# Patient Record
Sex: Female | Born: 2000 | Race: White | Hispanic: No | Marital: Single | Smoking: Former smoker
Health system: Southern US, Community
[De-identification: ages and names within clinical notes are randomized; demographics above are authoritative.]

## PROBLEM LIST (undated history)

## (undated) DIAGNOSIS — J45909 Unspecified asthma, uncomplicated: Secondary | ICD-10-CM

## (undated) DIAGNOSIS — G43909 Migraine, unspecified, not intractable, without status migrainosus: Secondary | ICD-10-CM

## (undated) DIAGNOSIS — F988 Other specified behavioral and emotional disorders with onset usually occurring in childhood and adolescence: Secondary | ICD-10-CM

## (undated) HISTORY — PX: TONSILLECTOMY: SUR1361

---
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
## 2021-01-10 ENCOUNTER — Emergency Department
Admission: EM | Admit: 2021-01-10 | Discharge: 2021-01-10 | Disposition: A | Discharge status: Home / Self Care | Payer: BLUE CROSS/BLUE SHIELD | Attending: Emergency Medicine | Admitting: Emergency Medicine

## 2021-01-10 ENCOUNTER — Encounter: Payer: Self-pay | Admitting: Emergency Medicine

## 2021-01-10 ENCOUNTER — Other Ambulatory Visit: Payer: Self-pay

## 2021-01-10 DIAGNOSIS — Z87891 Personal history of nicotine dependence: Secondary | ICD-10-CM | POA: Diagnosis not present

## 2021-01-10 DIAGNOSIS — T7840XA Allergy, unspecified, initial encounter: Secondary | ICD-10-CM

## 2021-01-10 DIAGNOSIS — Z91010 Allergy to peanuts: Secondary | ICD-10-CM | POA: Insufficient documentation

## 2021-01-10 DIAGNOSIS — X58XXXA Exposure to other specified factors, initial encounter: Secondary | ICD-10-CM | POA: Insufficient documentation

## 2021-01-10 DIAGNOSIS — J45909 Unspecified asthma, uncomplicated: Secondary | ICD-10-CM | POA: Diagnosis not present

## 2021-01-10 MED ORDER — METHYLPREDNISOLONE SODIUM SUCC 125 MG IJ SOLR
125.0000 mg | Freq: Once | INTRAMUSCULAR | Status: AC
  Administered 2021-01-10: 125 mg via INTRAVENOUS
  Filled 2021-01-10: qty 2

## 2021-01-10 MED ORDER — EPINEPHRINE 0.3 MG/0.3ML IJ SOAJ: 0.3000 mg | INTRAMUSCULAR | 1 refills | Status: DC | PRN

## 2021-01-10 MED ORDER — DIPHENHYDRAMINE HCL 50 MG/ML IJ SOLN
50.0000 mg | Freq: Once | INTRAMUSCULAR | Status: AC
  Administered 2021-01-10: 50 mg via INTRAVENOUS
  Filled 2021-01-10: qty 1

## 2021-01-10 NOTE — ED Triage Notes (Signed)
Patient ambulatory to triage with steady gait, without difficulty or distress noted; pt reports using epipen PTA after eating hotdog and soda; initially had swelling to tongue and cont to have diff swallowing

## 2021-01-10 NOTE — ED Provider Notes (Signed)
[facility] Emergency Department Provider Note  ____________________________________________  Time seen: Approximately 4:28 AM  I have reviewed the triage vital signs and the nursing notes.   HISTORY  Chief Complaint Allergic Reaction   HPI Leslie Logan is a 20 y.o. 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  Patient reports that she went to a cookout today and had a hot dog and a soda.  About 10 minutes after that she started noticed tingling and swelling of her tongue and was having difficulty swallowing which prompted her to use one of her EpiPen's.  She reports that these are similar symptoms she has had in the past with anaphylaxis.  At this time she reports that her symptoms resolved.  She used the EpiPen just prior to arrival.  She denies any throat closing sensation at this time, tongue swelling, wheezing, difficulty breathing, chest pain, shortness of breath, vomiting or diarrhea.   Past Medical History:  Diagnosis Date   Asthma    Migraine    Past Surgical History:  Procedure Laterality Date   TONSILLECTOMY      Prior to Admission medications   Medication Sig Start Date End Date Taking? Authorizing Provider  EPINEPHrine (EPIPEN 2-PAK) 0.3 mg/0.3 mL IJ SOAJ injection Inject 0.3 mg into the muscle as needed for anaphylaxis. 01/10/21  Yes Cristiano Capri, [place], MD  albuterol (PROVENTIL) (2.5 MG/3ML) 0.083% nebulizer solution Inhale into the lungs. 09/20/17   [provider]  albuterol (VENTOLIN HFA) 108 (90 Base) MCG/ACT inhaler Two puffs every 6 hours 09/20/17   [provider]  amphetamine-dextroamphetamine (ADDERALL) 10 MG tablet Take by mouth. 01/13/20   [provider]  budesonide (PULMICORT) 0.5 MG/2ML nebulizer solution Use via nebulizer along with albuterol respule  twice a dayas needed 10/09/19   [provider]   HYDROcodone-acetaminophen (NORCO) 5-325 MG tablet Take 1 tablet by mouth every 4 (four) hours as needed for moderate pain. 07/12/20   Merwyn Katos, MD  ibuprofen (ADVIL) 600 MG tablet Take 1 tablet (600 mg total) by mouth every 6 (six) hours as needed for moderate pain. 07/11/20   Shaune Pollack, MD  ipratropium-albuterol (DUONEB) 0.5-2.5 (3) MG/3ML SOLN Take 3 mLs by nebulization every 4 (four) hours as needed. 02/07/20   Sherrie Mustache Roselyn Bering, PA-C  nystatin-triamcinolone (MYCOLOG II) cream Apply to affected area once or twice a day as needed 05/27/20   Ivette Loyal, NP  ondansetron (ZOFRAN ODT) 4 MG disintegrating tablet Take 1 tablet (4 mg total) by mouth every 8 (eight) hours as needed for nausea or vomiting. 07/11/20   Shaune Pollack, MD  predniSONE (STERAPRED UNI-PAK 21 TAB) 10 MG (21) TBPK tablet Take 6 pills on day one then decrease by 1 pill each day 02/07/20   Faythe Ghee, PA-C    Allergies Barley grass, Corn-containing products, Food [peanut-containing drug products], Lactose, and Other  No family history on file.  Social History Social History   Tobacco Use   Smoking status: Former   Smokeless tobacco: Never  Building services engineer Use: Never used  Substance Use Topics   Alcohol use: Yes   Drug use: Yes    Types: Marijuana    Review of Systems  Constitutional: Negative for fever. Eyes: Negative for visual changes. ENT: + tongue swelling and throat closing sensation Neck: No neck pain  Cardiovascular: Negative for chest pain. Respiratory: Negative for shortness of breath. Gastrointestinal: Negative  for abdominal pain, vomiting or diarrhea. Genitourinary: Negative for dysuria. Musculoskeletal: Negative for back pain. Skin: Negative for rash. Neurological: Negative for headaches, weakness or numbness. Psych: No SI or HI  ____________________________________________   PHYSICAL EXAM:  VITAL SIGNS: ED Triage Vitals  Enc Vitals Group     BP 01/10/21 0342 131/85      Pulse Rate 01/10/21 0342 (!) 123     Resp 01/10/21 0342 18     Temp 01/10/21 0342 98 F (36.7 C)     Temp Source 01/10/21 0342 Oral     SpO2 01/10/21 0342 100 %     Weight 01/10/21 0339 140 lb (63.5 kg)     Height 01/10/21 0339 5' (1.524 m)     Head Circumference --      Peak Flow --      Pain Score 01/10/21 0338 0     Pain Loc --      Pain Edu? --      Excl. in GC? --     Constitutional: Alert and oriented. Well appearing and in no apparent distress. HEENT:      Head: Normocephalic and atraumatic.         Eyes: Conjunctivae are normal. Sclera is non-icteric.       Mouth/Throat: Mucous membranes are moist.  No angioedema, tongue and uvula are normal with no swelling, no stridor, airways patent      Neck: Supple with no signs of meningismus. Cardiovascular: Regular rate and rhythm. No murmurs, gallops, or rubs. 2+ symmetrical distal pulses are present in all extremities. No JVD. Respiratory: Normal respiratory effort. Lungs are clear to auscultation bilaterally.  Gastrointestinal: Soft, non tender. Musculoskeletal:  No edema, cyanosis, or erythema of extremities. Neurologic: Normal speech and language. Face is symmetric. Moving all extremities. No gross focal neurologic deficits are appreciated. Skin: Skin is warm, dry and intact. No rash noted. Psychiatric: Mood and affect are normal. Speech and behavior are normal.  ____________________________________________   LABS (all labs ordered are listed, but only abnormal results are displayed)  Labs Reviewed - No data to display ____________________________________________  EKG  none  ____________________________________________  RADIOLOGY  none  ____________________________________________   PROCEDURES  Procedure(s) performed: None Procedures   Critical Care performed:  None ____________________________________________   INITIAL IMPRESSION / ASSESSMENT AND PLAN / ED COURSE  20 y.o. 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  Patient was having tongue swelling and throat closing sensation after eating a cookout.  She is self administer an EpiPen prior to arrival.  Now back to baseline.  She denies any hives, chest pain, shortness of breath, wheezing, throat closing sensation, angioedema.  Has had several prior episodes of anaphylaxis.  Will monitor for 3 hours post EpiPen for any recurrence of her symptoms.  Will give IV Benadryl and Solu-Medrol.  Patient placed on telemetry for close monitoring.  _________________________ 6:22 AM on 01/10/2021 ----------------------------------------- Patient monitored for 3 hours post EpiPen with no recurrence of her symptoms.  Will discharge home on supportive care.  Patient given refill for her EpiPen.  Recommended follow-up with PCP.    _____________________________________________ Please note:  Patient was evaluated in Emergency Department today for the symptoms described in the history of present illness. Patient was evaluated in the context of the global COVID-19 pandemic, which necessitated consideration that the patient might be at risk for infection with the SARS-CoV-2 virus that causes COVID-19. Institutional protocols and algorithms that  pertain to the evaluation of patients at risk for COVID-19 are in a state of rapid change based on information released by regulatory bodies including the CDC and federal and state organizations. These policies and algorithms were followed during the patient's care in the ED.  Some ED evaluations and interventions may be delayed as a result of limited staffing during the pandemic.    Controlled Substance Database was reviewed by me. ____________________________________________   FINAL CLINICAL IMPRESSION(S) / ED DIAGNOSES   Final diagnoses:  Allergic reaction, initial encounter      NEW MEDICATIONS STARTED DURING THIS VISIT:  ED  Discharge Orders          Ordered    EPINEPHrine (EPIPEN 2-PAK) 0.3 mg/0.3 mL IJ SOAJ injection  As needed        01/10/21 0608             Note:  This document was prepared using Dragon voice recognition software and may include unintentional dictation errors.    Nita Sickle, MD 01/10/21 [phone]

## 2021-02-01 ENCOUNTER — Emergency Department: Payer: BLUE CROSS/BLUE SHIELD

## 2021-02-01 ENCOUNTER — Emergency Department
Admission: EM | Admit: 2021-02-01 | Discharge: 2021-02-01 | Disposition: A | Discharge status: Home / Self Care | Payer: BLUE CROSS/BLUE SHIELD | Attending: Emergency Medicine | Admitting: Emergency Medicine

## 2021-02-01 ENCOUNTER — Other Ambulatory Visit: Payer: Self-pay

## 2021-02-01 DIAGNOSIS — Z7951 Long term (current) use of inhaled steroids: Secondary | ICD-10-CM | POA: Insufficient documentation

## 2021-02-01 DIAGNOSIS — X501XXA Overexertion from prolonged static or awkward postures, initial encounter: Secondary | ICD-10-CM | POA: Diagnosis not present

## 2021-02-01 DIAGNOSIS — Z87891 Personal history of nicotine dependence: Secondary | ICD-10-CM | POA: Insufficient documentation

## 2021-02-01 DIAGNOSIS — J45909 Unspecified asthma, uncomplicated: Secondary | ICD-10-CM | POA: Diagnosis not present

## 2021-02-01 DIAGNOSIS — S93402A Sprain of unspecified ligament of left ankle, initial encounter: Secondary | ICD-10-CM | POA: Insufficient documentation

## 2021-02-01 DIAGNOSIS — Z91010 Allergy to peanuts: Secondary | ICD-10-CM | POA: Diagnosis not present

## 2021-02-01 DIAGNOSIS — S99912A Unspecified injury of left ankle, initial encounter: Secondary | ICD-10-CM | POA: Diagnosis present

## 2021-02-01 NOTE — ED Provider Notes (Signed)
[facility] Emergency Department Provider Note  ____________________________________________   Event Date/Time   First MD Initiated Contact with Patient 02/01/21 1102     (approximate)  I have reviewed the triage vital signs and the nursing notes.   HISTORY  Chief Complaint Ankle Pain    HPI Leslie Logan is a 20 y.o. female presents emergency department complaining of left ankle pain.  Patient has twisted the left ankle at least 3 times over the past few weeks.  History of ankle sprains and ligament tears.  States she feels like it is unstable and it keeps giving out on her.  No numbness or tingling.  Past Medical History:  Diagnosis Date   Asthma    Migraine     There are no problems to display for this patient.   Past Surgical History:  Procedure Laterality Date   TONSILLECTOMY      Prior to Admission medications   Medication Sig Start Date End Date Taking? Authorizing Provider  albuterol (PROVENTIL) (2.5 MG/3ML) 0.083% nebulizer solution Inhale into the lungs. 09/20/17   [provider]  albuterol (VENTOLIN HFA) 108 (90 Base) MCG/ACT inhaler Two puffs every 6 hours 09/20/17   [provider]  amphetamine-dextroamphetamine (ADDERALL) 10 MG tablet Take by mouth. 01/13/20   [provider]  budesonide (PULMICORT) 0.5 MG/2ML nebulizer solution Use via nebulizer along with albuterol respule  twice a dayas needed 10/09/19   [provider]  EPINEPHrine (EPIPEN 2-PAK) 0.3 mg/0.3 mL IJ SOAJ injection Inject 0.3 mg into the muscle as needed for anaphylaxis. 01/10/21   Nita Sickle, MD  HYDROcodone-acetaminophen [facility]) 5-325 MG tablet Take 1 tablet by mouth every 4 (four) hours as needed for moderate pain. 07/12/20   Merwyn Katos, MD  ibuprofen (ADVIL) 600 MG tablet Take 1 tablet (600 mg total) by mouth every 6 (six) hours as needed for moderate pain. 07/11/20   Shaune Pollack, MD   ipratropium-albuterol (DUONEB) 0.5-2.5 (3) MG/3ML SOLN Take 3 mLs by nebulization every 4 (four) hours as needed. 02/07/20   Sherrie Mustache Roselyn Bering, PA-C  nystatin-triamcinolone (MYCOLOG II) cream Apply to affected area once or twice a day as needed 05/27/20   Ivette Loyal, NP  ondansetron (ZOFRAN ODT) 4 MG disintegrating tablet Take 1 tablet (4 mg total) by mouth every 8 (eight) hours as needed for nausea or vomiting. 07/11/20   Shaune Pollack, MD  predniSONE (STERAPRED UNI-PAK 21 TAB) 10 MG (21) TBPK tablet Take 6 pills on day one then decrease by 1 pill each day 02/07/20   Faythe Ghee, PA-C    Allergies Barley grass, Corn-containing products, Food [peanut-containing drug products], Lactose, and Other  No family history on file.  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 ENT: No sore throat. Respiratory: Denies cough Genitourinary: Negative for dysuria. Musculoskeletal: Negative for back pain.  Positive for left ankle pain Skin: Negative for rash. Psychiatric: no mood changes,     ____________________________________________   PHYSICAL EXAM:  VITAL SIGNS: ED Triage Vitals  Enc Vitals Group     BP 02/01/21 0956 128/89     Pulse Rate 02/01/21 0956 (!) 109     Resp 02/01/21  0956 15     Temp 02/01/21 0956 98.1 F (36.7 C)     Temp Source 02/01/21 0956 Oral     SpO2 02/01/21 0956 100 %     Weight 02/01/21 0957 140 lb (63.5 kg)     Height 02/01/21 0957 5' (1.524 m)     Head Circumference --      Peak Flow --      Pain Score 02/01/21 0957 4     Pain Loc --      Pain Edu? --      Excl. in [place]? --     Constitutional: Alert and oriented. Well appearing and in no acute distress. Eyes: Conjunctivae are normal.  Head:  Atraumatic. Nose: No congestion/rhinnorhea. Mouth/Throat: Mucous membranes are moist.   Neck:  supple no lymphadenopathy noted Cardiovascular: Normal rate, regular rhythm.  Respiratory: Normal respiratory effort.  No retractions,  GU: deferred Musculoskeletal: FROM all extremities, warm and well perfused, left ankle has swelling noted anteriorly to the lateral malleolus, neurovascular is intact, full range of motion, no metatarsal tenderness Neurologic:  Normal speech and language.  Skin:  Skin is warm, dry and intact. No rash noted. Psychiatric: Mood and affect are normal. Speech and behavior are normal.  ____________________________________________   LABS (all labs ordered are listed, but only abnormal results are displayed)  Labs Reviewed - No data to display ____________________________________________   ____________________________________________  RADIOLOGY  X-ray of the left ankle  ____________________________________________   PROCEDURES  Procedure(s) performed: Cam boot applied by nursing staff   Procedures    ____________________________________________   INITIAL IMPRESSION / ASSESSMENT AND PLAN / ED COURSE  Pertinent labs & imaging results that were available during my care of the patient were reviewed by me and considered in my medical decision making (see chart for details).   Patient is a 20 year old female presents left ankle pain.  See HPI.  Physical exam shows the left ankle to be tender and swollen.  X-ray of the left ankle reviewed by me confirmed by radiology be negative for fracture.  I do feel the patient should see orthopedics.  She has a history of torn ligaments and am unsure if she is torn another ligament.  She was placed in a cam walker to protect the ankle.  She is to elevate and ice.  Take over-the-counter ibuprofen and Tylenol.  Discharged stable condition.     Leslie Logan was evaluated in Emergency Department on 02/01/2021  for the symptoms described in the history of present illness. She was evaluated in the context of the global COVID-19 pandemic, which necessitated consideration that the patient might be at risk for infection with the SARS-CoV-2 virus that causes COVID-19. Institutional protocols and algorithms that pertain to the evaluation of patients at risk for COVID-19 are in a state of rapid change based on information released by regulatory bodies including the CDC and federal and state organizations. These policies and algorithms were followed during the patient's care in the ED.    As part of my medical decision making, I reviewed the following data within the electronic MEDICAL RECORD NUMBER Nursing notes reviewed and incorporated, Old chart reviewed, Radiograph reviewed , Notes from prior ED visits, and [place] Controlled Substance Database  ____________________________________________   FINAL CLINICAL IMPRESSION(S) / ED DIAGNOSES  Final diagnoses:  Sprain of left ankle, unspecified ligament, initial encounter      NEW MEDICATIONS STARTED DURING THIS VISIT:  New Prescriptions   No medications on file  Note:  This document was prepared using Dragon voice recognition software and may include unintentional dictation errors.    Faythe Ghee, PA-C 02/01/21 1110    Merwyn Katos, MD 02/01/21 1434

## 2021-02-01 NOTE — ED Triage Notes (Signed)
Pt states c/o left ankle pain, states she Rolled it a couple weeks and again in the past week and feels like it is giving out

## 2021-02-01 NOTE — Discharge Instructions (Signed)
Follow-up with your regular doctor if not improving to 3 days.  Return if worsening.  Elevate and ice.  Follow-up with orthopedics, please call for an appointment

## 2021-04-11 ENCOUNTER — Emergency Department
Admission: EM | Admit: 2021-04-11 | Discharge: 2021-04-12 | Disposition: A | Discharge status: Home / Self Care | Payer: BLUE CROSS/BLUE SHIELD | Attending: Emergency Medicine | Admitting: Emergency Medicine

## 2021-04-11 ENCOUNTER — Other Ambulatory Visit: Payer: Self-pay

## 2021-04-11 ENCOUNTER — Encounter: Payer: Self-pay | Admitting: Emergency Medicine

## 2021-04-11 ENCOUNTER — Emergency Department: Payer: BLUE CROSS/BLUE SHIELD

## 2021-04-11 DIAGNOSIS — Z79899 Other long term (current) drug therapy: Secondary | ICD-10-CM | POA: Insufficient documentation

## 2021-04-11 DIAGNOSIS — I7300 Raynaud's syndrome without gangrene: Secondary | ICD-10-CM | POA: Insufficient documentation

## 2021-04-11 DIAGNOSIS — R200 Anesthesia of skin: Secondary | ICD-10-CM | POA: Diagnosis present

## 2021-04-11 DIAGNOSIS — R202 Paresthesia of skin: Secondary | ICD-10-CM | POA: Insufficient documentation

## 2021-04-11 DIAGNOSIS — J45909 Unspecified asthma, uncomplicated: Secondary | ICD-10-CM | POA: Insufficient documentation

## 2021-04-11 HISTORY — DX: Other specified behavioral and emotional disorders with onset usually occurring in childhood and adolescence: F98.8

## 2021-04-11 LAB — CBC
HCT: 45.3 % (ref 36.0–46.0)
Hemoglobin: 14.7 g/dL (ref 12.0–15.0)
MCH: 28.6 pg (ref 26.0–34.0)
MCHC: 32.5 g/dL (ref 30.0–36.0)
MCV: 88.1 fL (ref 80.0–100.0)
Platelets: 427 10*3/uL — ABNORMAL HIGH (ref 150–400)
RBC: 5.14 MIL/uL — ABNORMAL HIGH (ref 3.87–5.11)
RDW: 13.4 % (ref 11.5–15.5)
WBC: 11.2 10*3/uL — ABNORMAL HIGH (ref 4.0–10.5)
nRBC: 0 % (ref 0.0–0.2)

## 2021-04-11 LAB — BASIC METABOLIC PANEL
Anion gap: 9 (ref 5–15)
BUN: 12 mg/dL (ref 6–20)
CO2: 24 mmol/L (ref 22–32)
Calcium: 9.9 mg/dL (ref 8.9–10.3)
Chloride: 104 mmol/L (ref 98–111)
Creatinine, Ser: 0.62 mg/dL (ref 0.44–1.00)
GFR, Estimated: >60 mL/min (ref >60)
Glucose, Bld: 114 mg/dL — ABNORMAL HIGH (ref 70–99)
Potassium: 3.8 mmol/L (ref 3.5–5.1)
Sodium: 137 mmol/L (ref 135–145)

## 2021-04-11 LAB — POC URINE PREG, ED: Preg Test, Ur: NEGATIVE

## 2021-04-11 LAB — TROPONIN I (HIGH SENSITIVITY): Troponin I (High Sensitivity): 3 ng/L (ref <18)

## 2021-04-11 NOTE — ED Triage Notes (Signed)
Pt to ED from home c/o bilateral feet numbness for several weeks getting worse, feet feel cold, numbness to top of toes.  Patient has good color and cap refill to bilat toes.  Patient also states felt palpitations and like heart racing last night to right chest, denies SOB or n/v/d.  Pt A&Ox4, chest rise even and unlabored and in NAD at this time.

## 2021-04-12 LAB — TROPONIN I (HIGH SENSITIVITY): Troponin I (High Sensitivity): 3 ng/L (ref <18)

## 2021-04-12 LAB — C-REACTIVE PROTEIN: CRP: <0.5 mg/dL (ref <1.0)

## 2021-04-12 LAB — MAGNESIUM: Magnesium: 2 mg/dL (ref 1.7–2.4)

## 2021-04-12 LAB — D-DIMER, QUANTITATIVE: D-Dimer, Quant: <0.27 ug/mL-FEU (ref 0.00–0.50)

## 2021-04-12 LAB — SEDIMENTATION RATE: Sed Rate: 5 mm/hr (ref 0–20)

## 2021-04-12 LAB — T4, FREE: Free T4: 1.09 ng/dL (ref 0.61–1.12)

## 2021-04-12 LAB — TSH: TSH: 1.01 u[IU]/mL (ref 0.350–4.500)

## 2021-04-12 MED ORDER — SODIUM CHLORIDE 0.9 % IV BOLUS
1000.0000 mL | Freq: Once | INTRAVENOUS | Status: AC
  Administered 2021-04-12: 1000 mL via INTRAVENOUS

## 2021-04-12 NOTE — ED Notes (Signed)
Pt eating dinner tray °

## 2021-04-12 NOTE — ED Provider Notes (Signed)
[facility] Provider Note    Event Date/Time   First MD Initiated Contact with Patient 04/12/21 0006     (approximate)   History   Numbness and Chest Pain   HPI  Leslie Logan is a 21 y.o. female who presents to the ED from home with a chief complaint of bilateral toes numbness, bilateral feet feeling cold and mottled.  Symptoms x1 month.  Most noticeable when she is cold.  Also reports palpitations last night with discomfort in her right chest.  Denies associated diaphoresis, shortness of breath, nausea/vomiting, back pain or dizziness.  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  History of migraines not on prescription medications.  Takes Adderall for ADHD.  Denies family history of multiple sclerosis, autoimmune disorders.     Past Medical History   Past Medical History:  Diagnosis Date   ADD (attention deficit disorder)    Asthma    Migraine      Active Problem List  There are no problems to display for this patient.    Past Surgical History   Past Surgical History:  Procedure Laterality Date   TONSILLECTOMY       Home Medications   Prior to Admission medications   Medication Sig Start Date End Date Taking? Authorizing Provider  albuterol (PROVENTIL) (2.5 MG/3ML) 0.083% nebulizer solution Inhale into the lungs. 09/20/17   [provider]  albuterol (VENTOLIN HFA) 108 (90 Base) MCG/ACT inhaler Two puffs every 6 hours 09/20/17   [provider]  amphetamine-dextroamphetamine (ADDERALL) 10 MG tablet Take by mouth. 01/13/20   [provider]  budesonide (PULMICORT) 0.5 MG/2ML nebulizer solution Use via nebulizer along with albuterol respule  twice a dayas needed 10/09/19   [provider]  EPINEPHrine (EPIPEN 2-PAK) 0.3 mg/0.3 mL IJ SOAJ injection Inject 0.3 mg into the muscle as needed for anaphylaxis. 01/10/21   Nita Sickle, MD  HYDROcodone-acetaminophen  [facility]) 5-325 MG tablet Take 1 tablet by mouth every 4 (four) hours as needed for moderate pain. 07/12/20   Merwyn Katos, MD  ibuprofen (ADVIL) 600 MG tablet Take 1 tablet (600 mg total) by mouth every 6 (six) hours as needed for moderate pain. 07/11/20   Shaune Pollack, MD  ipratropium-albuterol (DUONEB) 0.5-2.5 (3) MG/3ML SOLN Take 3 mLs by nebulization every 4 (four) hours as needed. 02/07/20   Sherrie Mustache Roselyn Bering, PA-C  nystatin-triamcinolone (MYCOLOG II) cream Apply to affected area once or twice a day as needed 05/27/20   Ivette Loyal, NP  ondansetron (ZOFRAN ODT) 4 MG disintegrating tablet Take 1 tablet (4 mg total) by mouth every 8 (eight) hours as needed for nausea or vomiting. 07/11/20   Shaune Pollack, MD  predniSONE (STERAPRED UNI-PAK 21 TAB) 10 MG (21) TBPK tablet Take 6 pills on day one then decrease by 1 pill each day 02/07/20   Faythe Ghee, PA-C     Allergies  Barley grass, Corn-containing products, Food [peanut-containing drug products], Lactose, and Other   Family History  History reviewed. No pertinent family history.   Physical Exam  Triage Vital Signs: ED Triage Vitals [04/11/21 2030]  Enc Vitals Group     BP 130/86     Pulse Rate (!) 108     Resp 16     Temp 98.2 F (36.8 C)     Temp Source Oral     SpO2 100 %     Weight 140 lb (63.5 kg)  Height 5\' 5"  (1.651 m)     Head Circumference      Peak Flow      Pain Score 5     Pain Loc      Pain Edu?      Excl. in [place]?     Updated Vital Signs: BP 110/63    Pulse (!) 111    Temp 98.2 F (36.8 C) (Oral)    Resp 16    Ht 5\' 5"  (1.651 m)    Wt 63.5 kg    LMP 03/26/2021 (Approximate)    SpO2 100%    BMI 23.30 kg/m    General: Awake, no distress.  CV:  Good peripheral perfusion.  Resp:  Normal effort.  Abd:  No distention.  Other:  BLE symmetrically warm.  No calf swelling or tenderness.  2+ distal pulses.  Brisk, less than 5-second capillary refill.   ED Results / Procedures / Treatments  Labs (all  labs ordered are listed, but only abnormal results are displayed) Labs Reviewed  BASIC METABOLIC PANEL - Abnormal; Notable for the following components:      Result Value   Glucose, Bld 114 (*)    All other components within normal limits  CBC - Abnormal; Notable for the following components:   WBC 11.2 (*)    RBC 5.14 (*)    Platelets 427 (*)    All other components within normal limits  TSH  T4, FREE  SEDIMENTATION RATE  D-DIMER, QUANTITATIVE (NOT AT [facility])  MAGNESIUM  C-REACTIVE PROTEIN  ANTINUCLEAR ANTIBODIES, IFA  POC URINE PREG, ED  TROPONIN I (HIGH SENSITIVITY)  TROPONIN I (HIGH SENSITIVITY)     EKG  ED ECG REPORT I, Lauria Depoy J, the attending physician, personally viewed and interpreted this ECG.   Date: 04/12/2021  EKG Time: 2032  Rate: 103  Rhythm: sinus tachycardia  Axis: Normal  Intervals:none  ST&T Change: Nonspecific    RADIOLOGY I have personally reviewed patient's chest x-ray as well as the radiology interpretation:  Chest x-ray: No acute cardiopulmonary process  Official radiology report(s): DG Chest 2 View  Result Date: 04/11/2021 CLINICAL DATA:  Chest pain and palpitations. Bilateral lower extremity numbness. EXAM: CHEST - 2 VIEW COMPARISON:  07/15/2020 FINDINGS: The heart size and mediastinal contours are within normal limits. Both lungs are clear. The visualized skeletal structures are unremarkable. IMPRESSION: Normal exam. Electronically Signed   By: Marlaine Hind M.D.   On: 04/11/2021 21:03     PROCEDURES:  Critical Care performed: No  Procedures   MEDICATIONS ORDERED IN ED: Medications  sodium chloride 0.9 % bolus 1,000 mL (0 mLs Intravenous Stopped 04/12/21 0234)     IMPRESSION / MDM / ASSESSMENT AND PLAN / ED COURSE  I reviewed the triage vital signs and the nursing notes.                             21 year old otherwise healthy female presenting for occasional numbness to her dorsal toes bilaterally, feet feeling cold,  mottled especially in cold weather; also complains of palpitations and chest pain. Differential diagnosis includes, but is not limited to, ACS, aortic dissection, pulmonary embolism, cardiac tamponade, pneumothorax, pneumonia, pericarditis, myocarditis, GI-related causes including esophagitis/gastritis, and musculoskeletal chest wall pain.     I have personally reviewed patient's records and reviewed her PCP office visit from 03/15/2021.  Laboratory results unremarkable thus far.  We will add thyroid panel, magnesium, D-dimer given chest pain with  recent travel.  Clinically patient appears will perfused without evidence for ischemia.  She is currently not having any paresthesias symptoms.  Do not feel CTA BLE is warranted at this time.  There are no focal neurological deficits; do not feel MRI brain is warranted given her symptoms of occasional numbness to the dorsal toes on both feet without weakness.  From what she was describing and showing me pictures of, suspect Raynaud's phenomenon.  Will check inflammatory markers, ANA.  Will reassess.  Clinical Course as of 04/12/21 0411  Wed Apr 12, 2021  0214 Patient eating sandwich in no acute distress.  Updated her of test results thus far.  Inflammatory markers and ANA are send outs.  Patient will follow-up those results via [place].  Will refer patient to rheumatology for follow-up for likely Raynauds disease.  Strict return precautions given.  Patient verbalizes understanding agrees with plan of care. [JS]    Clinical Course User Index [JS] Paulette Blanch, MD     FINAL CLINICAL IMPRESSION(S) / ED DIAGNOSES   Final diagnoses:  Paresthesia  Raynaud's phenomenon without gangrene     Rx / DC Orders   ED Discharge Orders     None        Note:  This document was prepared using Dragon voice recognition software and may include unintentional dictation errors.   Paulette Blanch, MD 04/12/21 [phone]

## 2021-04-12 NOTE — ED Notes (Signed)
Iv infusing, labs sent

## 2021-04-12 NOTE — ED Notes (Signed)
Pt reports numbness/coldness to both feet for several weeks.  Pt reports pain in left foot and pain when ambulating.  No known injury.  No deformity noted.  Pt alert speech clear.  Pt in hallway bed.

## 2021-04-12 NOTE — Discharge Instructions (Signed)
You may check the results of send out labs via MyChart.  Return to the ER for worsening symptoms, persistent vomiting, difficulty breathing or other concerns.

## 2021-06-07 ENCOUNTER — Other Ambulatory Visit: Payer: Self-pay

## 2021-06-07 ENCOUNTER — Encounter: Payer: Self-pay | Admitting: Emergency Medicine

## 2021-06-07 ENCOUNTER — Emergency Department
Admission: EM | Admit: 2021-06-07 | Discharge: 2021-06-07 | Disposition: A | Discharge status: Home / Self Care | Payer: BC Managed Care – PPO | Attending: Emergency Medicine | Admitting: Emergency Medicine

## 2021-06-07 DIAGNOSIS — S0990XA Unspecified injury of head, initial encounter: Secondary | ICD-10-CM | POA: Insufficient documentation

## 2021-06-07 DIAGNOSIS — J45909 Unspecified asthma, uncomplicated: Secondary | ICD-10-CM | POA: Diagnosis not present

## 2021-06-07 DIAGNOSIS — Y9367 Activity, basketball: Secondary | ICD-10-CM | POA: Insufficient documentation

## 2021-06-07 DIAGNOSIS — W2105XA Struck by basketball, initial encounter: Secondary | ICD-10-CM | POA: Insufficient documentation

## 2021-06-07 LAB — POC URINE PREG, ED: Preg Test, Ur: NEGATIVE

## 2021-06-07 MED ORDER — METOCLOPRAMIDE HCL 10 MG PO TABS
10.0000 mg | ORAL_TABLET | Freq: Once | ORAL | Status: AC
  Administered 2021-06-07: 10 mg via ORAL
  Filled 2021-06-07: qty 1

## 2021-06-07 MED ORDER — IBUPROFEN 400 MG PO TABS
400.0000 mg | ORAL_TABLET | Freq: Once | ORAL | Status: AC
  Administered 2021-06-07: 400 mg via ORAL
  Filled 2021-06-07: qty 1

## 2021-06-07 MED ORDER — METOCLOPRAMIDE HCL 10 MG PO TABS: 10.0000 mg | ORAL_TABLET | Freq: Three times a day (TID) | ORAL | 0 refills | Status: DC

## 2021-06-07 NOTE — ED Provider Notes (Signed)
? ?[facility] ?Provider Note ? ? ? Event Date/Time  ? First MD Initiated Contact with Patient 06/07/21 0029   ?  (approximate) ? ? ?History  ? ?Head Injury ? ? ?HPI ? ?Leslie Logan is a 21 y.o. female past medical history of ADHD, as being migraine and multiple concussions who presents with concern for concussion.  Yesterday he morning patient was playing basketball she was hit in the head with the ball.  She saw stars but did not lose consciousness.  Initially did not have many symptoms other than some nasal swelling.  However throughout the day today she has had intermittent headache she had 2 episodes of emesis and feels like her memory is off.  Also feels like she is somewhat confused and her speech is slurred.  She denies focal numbness, tingling, weakness, or visual change.  ?  ? ?Past Medical History:  ?Diagnosis Date  ? ADD (attention deficit disorder)   ? Asthma   ? Migraine   ? ? ?There are no problems to display for this patient. ? ? ? ?Physical Exam  ?Triage Vital Signs: ?ED Triage Vitals [06/07/21 0010]  ?Enc Vitals Group  ?   BP   ?   Pulse Rate 96  ?   Resp 18  ?   Temp   ?   Temp Source Oral  ?   SpO2 99 %  ?   Weight 140 lb (63.5 kg)  ?   Height 5' (1.524 m)  ?   Head Circumference   ?   Peak Flow   ?   Pain Score 5  ?   Pain Loc   ?   Pain Edu?   ?   Excl. in GC?   ? ? ?Most recent vital signs: ?Vitals:  ? 06/07/21 0010  ?Pulse: 96  ?Resp: 18  ?SpO2: 99%  ? ? ? ?General: Awake, no distress.  ?CV:  Good peripheral perfusion.  ?Resp:  Normal effort.  ?Abd:  No distention.  ?Neuro:             Awake, Alert, Oriented x 3  ?Other:  Aox3, nml speech  ?PERRL, EOMI, face symmetric, nml tongue movement  ?5/5 strength in the BL upper and lower extremities  ?Sensation grossly intact in the BL upper and lower extremities  ?Finger-nose-finger intact BL ?Normal gait ? ?No signs of trauma to the head or face ? ? ?ED Results / Procedures / Treatments  ?Labs ?(all labs ordered  are listed, but only abnormal results are displayed) ?Labs Reviewed  ?POC URINE PREG, ED  ? ? ? ?EKG ? ? ? ? ?RADIOLOGY ? ? ? ?PROCEDURES: ? ?Critical Care performed: No ? ? ? ?MEDICATIONS ORDERED IN ED: ?Medications  ?ibuprofen (ADVIL) tablet 400 mg (400 mg Oral Given 06/07/21 0124)  ?metoCLOPramide (REGLAN) tablet 10 mg (10 mg Oral Given 06/07/21 0124)  ? ? ? ?IMPRESSION / MDM / ASSESSMENT AND PLAN / ED COURSE  ?I reviewed the triage vital signs and the nursing notes. ?             ?               ? ?Differential diagnosis includes, but is not limited to, concussion, migraine headache, ICH less likely ? ?This patient is a 21 year old female with history of migraines and multiple concussions in the past who presents with concern for concussion symptoms.  Patient was hit in the head with basketball yesterday did  not lose consciousness.  She is not on any blood thinners.  At the day today she has had headache memory difficulty and feels like her speech is slurred and she is slow to respond.  She had 2 episodes of vomiting after going for a run today.  On exam she appears well her speech is normal she is not dysarthric or aphasic.  She has no signs of trauma including no signs of basilar skull fracture.  She has an intact neurologic exam including gait and mental status.  Does have symptoms of concussion.  My suspicion for intracranial hemorrhage is very low.  By [place] CT rules could consider CT based on the 2 episodes of emesis however she meets no other criteria and based on the mechanism my suspicion for injury is low.  She also has a history of migraine headache and concussions putting her at greater risk for this.  With her normal neurologic exam do not feel that she needs imaging today.  We will treat supportively with naproxen and Reglan.  hCG ordered given the vomiting.  I advised that she follow-up with student health or neurology or primary care for repeat evaluation in 1 week.  Discussed concussion  precautions including avoiding activities in which she could sustain a second injury and avoiding screen time.  We will provide a note for school. ? ?  ? ? ?FINAL CLINICAL IMPRESSION(S) / ED DIAGNOSES  ? ?Final diagnoses:  ?Minor head injury, initial encounter  ? ? ? ?Rx / DC Orders  ? ?ED Discharge Orders   ? ? None  ? ?  ? ? ? ?Note:  This document was prepared using Dragon voice recognition software and may include unintentional dictation errors. ?  ?Georga Hacking, MD ?06/07/21 0129 ? ?

## 2021-06-07 NOTE — ED Triage Notes (Signed)
Patient ambulatory to triage with steady gait, without difficulty or distress noted; pt reports that she was hit in the face playing bball Monday night; today c/o "sharp" intermittent pains to side of head; st hx concussions and was concerned  ?

## 2021-06-07 NOTE — Discharge Instructions (Signed)
You likely have a minor concussion.  You can take naproxen and Reglan for your headache symptoms.  Please follow-up either with student health or with neurology or primary care for repeat evaluation in 1 week to make sure your symptoms are improving.  In the meantime please avoid screen time and take several days off from class if you are able. ?

## 2021-07-13 ENCOUNTER — Ambulatory Visit
Admission: EM | Admit: 2021-07-13 | Discharge: 2021-07-13 | Disposition: A | Discharge status: Home / Self Care | Payer: BC Managed Care – PPO

## 2021-07-13 ENCOUNTER — Encounter: Payer: Self-pay | Admitting: Emergency Medicine

## 2021-07-13 DIAGNOSIS — R238 Other skin changes: Secondary | ICD-10-CM

## 2021-07-13 NOTE — ED Triage Notes (Addendum)
Pt was using a tool to remove blackheads from her nose and has some bruising and swelling since yesterday.  ?

## 2021-07-13 NOTE — ED Provider Notes (Signed)
?[facility] ? ? ? ?CSN: [id number] ?Arrival date & time: 07/13/21  1655 ? ? ?  ? ?History   ?Chief Complaint ?Chief Complaint  ?Patient presents with  ? Facial Swelling  ? ? ?HPI ?Leslie Logan is a 21 y.o. female presenting with skin irritation over her nose following extracting blackheads at home 1 day ago.  History noncontributory.  Describes using a device to suction black inside of her nose, states she must have pushed very hard because now she is having pain along the bridge of her nose, and skin irritation.  Has attempted an emollient cream at home with minimal relief. ? ?HPI ? ?Past Medical History:  ?Diagnosis Date  ? ADD (attention deficit disorder)   ? Asthma   ? Migraine   ? ? ?There are no problems to display for this patient. ? ? ?Past Surgical History:  ?Procedure Laterality Date  ? TONSILLECTOMY    ? ? ?OB History   ?No obstetric history on file. ?  ? ? ? ?Home Medications   ? ?Prior to Admission medications   ?Medication Sig Start Date End Date Taking? Authorizing Provider  ?amphetamine-dextroamphetamine (ADDERALL) 10 MG tablet Take by mouth. 01/13/20  Yes [provider]  ?EPINEPHrine 0.3 mg/0.3 mL IJ SOAJ injection Inject into the muscle. 10/01/19  Yes [provider]  ?albuterol (PROVENTIL) (2.5 MG/3ML) 0.083% nebulizer solution Inhale into the lungs. 09/20/17   [provider]  ?albuterol (VENTOLIN HFA) 108 (90 Base) MCG/ACT inhaler Two puffs every 6 hours 09/20/17   [provider]  ?budesonide (PULMICORT) 0.5 MG/2ML nebulizer solution Use via nebulizer along with albuterol respule  twice a dayas needed 10/09/19   [provider]  ?HYDROcodone-acetaminophen (NORCO) 5-325 MG tablet Take 1 tablet by mouth every 4 (four) hours as needed for moderate pain. 07/12/20   Merwyn KatosBradler, Evan K, MD  ?ibuprofen (ADVIL) 600 MG tablet Take 1 tablet (600 mg total) by mouth every 6 (six) hours as needed for moderate pain. 07/11/20   Shaune PollackIsaacs, Cameron, MD   ?ipratropium-albuterol (DUONEB) 0.5-2.5 (3) MG/3ML SOLN Take 3 mLs by nebulization every 4 (four) hours as needed. 02/07/20   Fisher, Roselyn BeringSusan W, PA-C  ?metoCLOPramide (REGLAN) 10 MG tablet Take 1 tablet (10 mg total) by mouth 3 (three) times daily with meals. 06/07/21 07/07/21  Georga HackingMcHugh, Kelly Rose, MD  ?nystatin-triamcinolone [facility](MYCOLOG II) cream Apply to affected area once or twice a day as needed 05/27/20   Ivette LoyalSmith, Fowler R, NP  ?ondansetron (ZOFRAN ODT) 4 MG disintegrating tablet Take 1 tablet (4 mg total) by mouth every 8 (eight) hours as needed for nausea or vomiting. 07/11/20   Shaune PollackIsaacs, Cameron, MD  ?predniSONE (STERAPRED UNI-PAK 21 TAB) 10 MG (21) TBPK tablet Take 6 pills on day one then decrease by 1 pill each day 02/07/20   Faythe GheeFisher, Susan W, PA-C  ? ? ?Family History ?No family history on file. ? ?Social History ?Social History  ? ?Tobacco Use  ? Smoking status: Former  ? Smokeless tobacco: Never  ?Vaping Use  ? Vaping Use: Never used  ?Substance Use Topics  ? Alcohol use: Yes  ? Drug use: Yes  ?  Types: Marijuana  ? ? ? ?Allergies   ?Barley grass, Corn-containing products, Food [peanut-containing drug products], Lactose, Other, and Uncaria tomentosa (cats claw) ? ? ?Review of Systems ?Review of Systems  ?Skin:  Positive for color change.  ?All other systems reviewed and are negative. ? ? ?Physical Exam ?Triage Vital Signs ?ED Triage Vitals  ?  Enc Vitals Group  ?   BP 07/13/21 1722 118/78  ?   Pulse Rate 07/13/21 1722 (!) 103  ?   Resp 07/13/21 1722 18  ?   Temp 07/13/21 1722 98.1 ?F (36.7 ?C)  ?   Temp Source 07/13/21 1722 Oral  ?   SpO2 07/13/21 1722 99 %  ?   Weight --   ?   Height --   ?   Head Circumference --   ?   Peak Flow --   ?   Pain Score 07/13/21 1719 6  ?   Pain Loc --   ?   Pain Edu? --   ?   Excl. in GC? --   ? ?No data found. ? ?Updated Vital Signs ?BP 118/78 (BP Location: Left Arm)   Pulse (!) 103   Temp 98.1 ?F (36.7 ?C) (Oral)   Resp 18   LMP 07/07/2021   SpO2 99%  ? ?Visual Acuity ?Right Eye  Distance:   ?Left Eye Distance:   ?Bilateral Distance:   ? ?Right Eye Near:   ?Left Eye Near:    ?Bilateral Near:    ? ?Physical Exam ?Vitals reviewed.  ?Constitutional:   ?   General: She is not in acute distress. ?   Appearance: Normal appearance. She is not ill-appearing.  ?HENT:  ?   Head: Normocephalic and atraumatic.  ?Pulmonary:  ?   Effort: Pulmonary effort is normal.  ?Skin: ?   Comments: See image below ?Bridge of nose with inflamed erythematous patches. There is no warmth or honey colored crusting. These is some tenderness to palpation along the bridge of the nose, with minimal maxillary sinus tenderness. No obvious septal hematoma.   ?Neurological:  ?   General: No focal deficit present.  ?   Mental Status: She is alert and oriented to person, place, and time.  ?Psychiatric:     ?   Mood and Affect: Mood normal.     ?   Behavior: Behavior normal.     ?   Thought Content: Thought content normal.     ?   Judgment: Judgment normal.  ? ? ? ? ? ?UC Treatments / Results  ?Labs ?(all labs ordered are listed, but only abnormal results are displayed) ?Labs Reviewed - No data to display ? ?EKG ? ? ?Radiology ?No results found. ? ?Procedures ?Procedures (including critical care time) ? ?Medications Ordered in UC ?Medications - No data to display ? ?Initial Impression / Assessment and Plan / UC Course  ?I have reviewed the triage vital signs and the nursing notes. ? ?Pertinent labs & imaging results that were available during my care of the patient were reviewed by me and considered in my medical decision making (see chart for details). ? ?  ? ?This patient is a very pleasant 21 y.o. year old female presenting with skin irritation and nasal pain following extracting blackheads at home. Reassurance provided; continue emollient cream for skin irritation. There is some nasal pain, but minimal surrounding sinus pain. Discussed the "Danger Triangle" of the face, and when to seek additional medical attention - increasing  pain, new fevers, dizziness, etc. She understands to head to the ED if these symptoms develop.  ? ?Final Clinical Impressions(s) / UC Diagnoses  ? ?Final diagnoses:  ?Skin irritation  ? ? ? ?Discharge Instructions   ? ?  ?-Declines AVS. ? ? ?ED Prescriptions   ?None ?  ? ?PDMP not reviewed this encounter. ?  ?Ignacia Bayley  E, PA-C ?07/13/21 1751 ? ?

## 2021-07-13 NOTE — Discharge Instructions (Addendum)
Declines AVS 

## 2021-11-18 ENCOUNTER — Other Ambulatory Visit: Payer: Self-pay

## 2021-11-18 ENCOUNTER — Encounter: Payer: Self-pay | Admitting: Emergency Medicine

## 2021-11-18 ENCOUNTER — Emergency Department
Admission: EM | Admit: 2021-11-18 | Discharge: 2021-11-18 | Disposition: A | Discharge status: Home / Self Care | Payer: BC Managed Care – PPO | Attending: Emergency Medicine | Admitting: Emergency Medicine

## 2021-11-18 DIAGNOSIS — L232 Allergic contact dermatitis due to cosmetics: Secondary | ICD-10-CM

## 2021-11-18 DIAGNOSIS — R21 Rash and other nonspecific skin eruption: Secondary | ICD-10-CM | POA: Diagnosis present

## 2021-11-18 MED ORDER — ERYTHROMYCIN 5 MG/GM OP OINT: 1.0000 | TOPICAL_OINTMENT | Freq: Three times a day (TID) | OPHTHALMIC | 1 refills | Status: AC

## 2021-11-18 MED ORDER — PREDNISONE 10 MG (21) PO TBPK: ORAL_TABLET | ORAL | 0 refills | Status: DC

## 2021-11-18 NOTE — ED Provider Notes (Signed)
[facility] Provider Note    Event Date/Time   First MD Initiated Contact with Patient 11/18/21 1235     (approximate)   History   Eye Drainage   HPI  Leslie Logan is a 21 y.o. female with no significant past medical hx presents with rash around her eyes after have a permanent curl put on her lashes.  States areas is red and swollen, itching.  No drainage from eyes, no change in vision.  No other swelling, no dif breathing      Physical Exam   Triage Vital Signs: ED Triage Vitals  Enc Vitals Group     BP 11/18/21 1220 121/72     Pulse Rate 11/18/21 1220 64     Resp 11/18/21 1220 16     Temp 11/18/21 1220 98.4 F (36.9 C)     Temp Source 11/18/21 1220 Oral     SpO2 11/18/21 1220 100 %     Weight 11/18/21 1221 135 lb (61.2 kg)     Height 11/18/21 1221 5\' 10"  (1.778 m)     Head Circumference --      Peak Flow --      Pain Score 11/18/21 1220 4     Pain Loc --      Pain Edu? --      Excl. in GC? --     Most recent vital signs: Vitals:   11/18/21 1220  BP: 121/72  Pulse: 64  Resp: 16  Temp: 98.4 F (36.9 C)  SpO2: 100%     General: Awake, no distress.   CV:  Good peripheral perfusion. regular rate and  rhythm Resp:  Normal effort. Lungs cta Abd:  No distention.   Other:  Skin with redness and irration of periorbital area near lashes only, eomi, no pain reproduced with movement of eyes, no other swelling noted   ED Results / Procedures / Treatments   Labs (all labs ordered are listed, but only abnormal results are displayed) Labs Reviewed - No data to display   EKG     RADIOLOGY     PROCEDURES:   Procedures   MEDICATIONS ORDERED IN ED: Medications - No data to display   IMPRESSION / MDM / ASSESSMENT AND PLAN / ED COURSE  I reviewed the triage vital signs and the nursing notes.                              Differential diagnosis includes, but is not limited to, contact dermatitis, cellulitis,  chemical burn  Patient's presentation is most consistent with acute uncomplicated illness   Pts exam is consistent with contact dermatitis, does not appear to be a orbital cellulitis.  I did explain the findings to the patient and her mother.  She was given a rx for sterapred and erythromycin op ointment.  She is to follow up with her regular doctor if not improving in 3 days.  REturn if worsening.  She agrees with the treatment plan and was discharged in stable condition      FINAL CLINICAL IMPRESSION(S) / ED DIAGNOSES   Final diagnoses:  Allergic contact dermatitis due to cosmetics     Rx / DC Orders   ED Discharge Orders          Ordered    predniSONE (STERAPRED UNI-PAK 21 TAB) 10 MG (21) TBPK tablet        11/18/21 1306  erythromycin ophthalmic ointment  3 times daily        11/18/21 1306             Note:  This document was prepared using Dragon voice recognition software and may include unintentional dictation errors.    Faythe Ghee, PA-C 11/18/21 1340    Shaune Pollack, MD 11/19/21 [phone]

## 2021-11-18 NOTE — Discharge Instructions (Signed)
You are having a reaction to the chemicals used on your eyes.  Use the prednisone as prescribed.  Erythromycin eye ointment 3 times daily for comfort.

## 2021-11-18 NOTE — ED Triage Notes (Signed)
Pt via POV from home. Pt c/o bilateral eye itchiness, drainage, and redness for the past 4 days. Pt is A&Ox4 and NAD

## 2022-04-13 ENCOUNTER — Emergency Department: Payer: BC Managed Care – PPO

## 2022-04-13 ENCOUNTER — Emergency Department
Admission: EM | Admit: 2022-04-13 | Discharge: 2022-04-13 | Disposition: A | Discharge status: Home / Self Care | Payer: BC Managed Care – PPO | Attending: Emergency Medicine | Admitting: Emergency Medicine

## 2022-04-13 ENCOUNTER — Other Ambulatory Visit: Payer: Self-pay

## 2022-04-13 DIAGNOSIS — R079 Chest pain, unspecified: Secondary | ICD-10-CM

## 2022-04-13 LAB — BASIC METABOLIC PANEL
Anion gap: 8 (ref 5–15)
BUN: 18 mg/dL (ref 6–20)
CO2: 24 mmol/L (ref 22–32)
Calcium: 9.6 mg/dL (ref 8.9–10.3)
Chloride: 107 mmol/L (ref 98–111)
Creatinine, Ser: 0.59 mg/dL (ref 0.44–1.00)
GFR, Estimated: >60 mL/min (ref >60)
Glucose, Bld: 103 mg/dL — ABNORMAL HIGH (ref 70–99)
Potassium: 4.3 mmol/L (ref 3.5–5.1)
Sodium: 139 mmol/L (ref 135–145)

## 2022-04-13 LAB — D-DIMER, QUANTITATIVE: D-Dimer, Quant: <0.27 ug/mL-FEU (ref 0.00–0.50)

## 2022-04-13 LAB — CBC
HCT: 46.8 % — ABNORMAL HIGH (ref 36.0–46.0)
Hemoglobin: 15.1 g/dL — ABNORMAL HIGH (ref 12.0–15.0)
MCH: 28.8 pg (ref 26.0–34.0)
MCHC: 32.3 g/dL (ref 30.0–36.0)
MCV: 89.3 fL (ref 80.0–100.0)
Platelets: 377 10*3/uL (ref 150–400)
RBC: 5.24 MIL/uL — ABNORMAL HIGH (ref 3.87–5.11)
RDW: 14.2 % (ref 11.5–15.5)
WBC: 7.3 10*3/uL (ref 4.0–10.5)
nRBC: 0 % (ref 0.0–0.2)

## 2022-04-13 LAB — TROPONIN I (HIGH SENSITIVITY): Troponin I (High Sensitivity): <2 ng/L (ref <18)

## 2022-04-13 MED ORDER — KETOROLAC TROMETHAMINE 30 MG/ML IJ SOLN
30.0000 mg | Freq: Once | INTRAMUSCULAR | Status: AC
  Administered 2022-04-13: 30 mg via INTRAVENOUS
  Filled 2022-04-13: qty 1

## 2022-04-13 NOTE — ED Provider Notes (Signed)
[facility] Provider Note  Patient Contact: 3:05 PM (approximate)   History   Chest Pain   HPI  Leslie Logan is a 22 y.o. female with a largely unremarkable past medical history, presents to the emergency department with intermittent chest pain since Wednesday.  Patient states that her chest pain is fairly constant but she does have periods of time when she does not feel discomfort.  She reports the pain is not provoked with eating.  Patient states that her pain is mostly on the right side and os reproduced with palpation but not deep inspiration.  Patient is a daily smoker of marijuana and has recently returned from a trip to [place].  She did state that she used the bathroom several times during her plane ride and walked back-and-forth some.  She denies cough or recent illness.  No exertional shortness of breath.  No fever.  No family history of clotting disorders.  Patient states that she does have a family history of cardiac disease on her dad side.  No recent surgeries or prolonged immobilization other than flight from [place].  No lower extremity swelling or calf pain.      Physical Exam   Triage Vital Signs: ED Triage Vitals  Enc Vitals Group     BP 04/13/22 1418 126/83     Pulse Rate 04/13/22 1418 (!) 104     Resp 04/13/22 1418 17     Temp 04/13/22 1418 99 F (37.2 C)     Temp Source 04/13/22 1418 Oral     SpO2 04/13/22 1418 98 %     Weight --      Height --      Head Circumference --      Peak Flow --      Pain Score 04/13/22 1415 6     Pain Loc --      Pain Edu? --      Excl. in [place]? --     Most recent vital signs: Vitals:   04/13/22 1455 04/13/22 1639  BP: 119/81 120/70  Pulse: 91   Resp: 20 18  Temp: 98.3 F (36.8 C) 98.2 F (36.8 C)  SpO2: 98% 100%     General: Alert and in no acute distress. Eyes:  PERRL. EOMI. Head: No acute traumatic findings ENT:      Nose: No congestion/rhinnorhea.      Mouth/Throat: Mucous  membranes are moist. Neck: No stridor. No cervical spine tenderness to palpation. Cardiovascular:  Good peripheral perfusion Respiratory: Normal respiratory effort without tachypnea or retractions. Lungs CTAB. Good air entry to the bases with no decreased or absent breath sounds. Gastrointestinal: Bowel sounds 4 quadrants. Soft and nontender to palpation. No guarding or rigidity. No palpable masses. No distention. No CVA tenderness. Musculoskeletal: Full range of motion to all extremities.  Neurologic:  No gross focal neurologic deficits are appreciated.  Skin:   No rash noted Other:   ED Results / Procedures / Treatments   Labs (all labs ordered are listed, but only abnormal results are displayed) Labs Reviewed  BASIC METABOLIC PANEL - Abnormal; Notable for the following components:      Result Value   Glucose, Bld 103 (*)    All other components within normal limits  CBC - Abnormal; Notable for the following components:   RBC 5.24 (*)    Hemoglobin 15.1 (*)    HCT 46.8 (*)    All other components within normal limits  D-DIMER, QUANTITATIVE  TROPONIN  I (HIGH SENSITIVITY)     EKG  Normal sinus rhythm without ST segment elevation or other apparent arrhythmia.   RADIOLOGY  I personally viewed and evaluated these images as part of my medical decision making, as well as reviewing the written report by the radiologist.  ED Provider Interpretation: Chest x-ray unremarkable.   PROCEDURES:  Critical Care performed: No  Procedures   MEDICATIONS ORDERED IN ED: Medications  ketorolac (TORADOL) 30 MG/ML injection 30 mg (30 mg Intravenous Given 04/13/22 1636)     IMPRESSION / MDM / ASSESSMENT AND PLAN / ED COURSE  I reviewed the triage vital signs and the nursing notes.                             Assessment and plan: Chest pain:  22 year old female presents to the emergency department with right-sided chest pain that has occurred intermittently since  Wednesday.  Vital signs are reassuring at triage.  On exam, patient was alert and nontoxic-appearing without increased work of breathing.  She had no adventitious lung sounds.  She had good breath sounds in the lung bases.  Differential diagnosis includes costochondritis, GERD, PE, pneumonia, pneumothorax....  Given recent travel from [place], will obtain D-dimer and will reassess.  Troponin within range.  CBC reassuring.  No acute abnormality on BMP.   D-dimer within range.  Patient was given injection of Toradol prior to discharge and advised to use naproxen twice daily for the next 7 days.  FINAL CLINICAL IMPRESSION(S) / ED DIAGNOSES   Final diagnoses:  Nonspecific chest pain     Rx / DC Orders   ED Discharge Orders     None        Note:  This document was prepared using Dragon voice recognition software and may include unintentional dictation errors.   Vallarie Mare Maynard, PA-C 04/13/22 1650    Harvest Dark, MD 04/13/22 1928

## 2022-04-13 NOTE — ED Triage Notes (Signed)
Pt comes with c/o cp. Pt states this all started Wednesday night. Pt states it has gotten worse. Pt states it has been coming and going. Pt states 6/10. Pt states it is sharp pain.

## 2022-04-13 NOTE — Discharge Instructions (Addendum)
Take Naproxen twice daily for the next seven days.

## 2022-04-25 ENCOUNTER — Ambulatory Visit
Admission: EM | Admit: 2022-04-25 | Discharge: 2022-04-25 | Disposition: A | Discharge status: Home / Self Care | Payer: BC Managed Care – PPO | Attending: Urgent Care | Admitting: Urgent Care

## 2022-04-25 DIAGNOSIS — R0789 Other chest pain: Secondary | ICD-10-CM

## 2022-04-25 DIAGNOSIS — I200 Unstable angina: Secondary | ICD-10-CM

## 2022-04-25 DIAGNOSIS — J45901 Unspecified asthma with (acute) exacerbation: Secondary | ICD-10-CM

## 2022-04-25 DIAGNOSIS — M940 Chondrocostal junction syndrome [Tietze]: Secondary | ICD-10-CM

## 2022-04-25 LAB — ED EKG

## 2022-04-25 MED ORDER — PREDNISONE 20 MG PO TABS: ORAL_TABLET | ORAL | 0 refills | Status: AC

## 2022-04-25 NOTE — ED Triage Notes (Signed)
Pt. Presents to UC w/ c/o right-sided chest pain and SOB. Pt. States this has been a recurrent issue since last week.  Pt. States she Was recently seen @ the ED for similar symptoms.

## 2022-04-25 NOTE — Discharge Instructions (Signed)
Follow up with your primary care provider if your symptoms are worsening or not improving.    

## 2022-04-25 NOTE — ED Provider Notes (Signed)
[facility]    CSN: [id number] Arrival date & time: 04/25/22  0900      History   Chief Complaint Chief Complaint  Patient presents with   Chest Pain   Shortness of Breath    HPI Leslie Logan is a 22 y.o. female.    Chest Pain Associated symptoms: shortness of breath   Shortness of Breath Associated symptoms: chest pain     Patient presents to urgent care with renewed complaint of right-sided chest pain and shortness of breath.  She states this is a recurrent issue since last week.  Patient was seen at the ED on 04/13/2022 for chest pain occurring intermittently.  She endorses cough x 2 days which she believes is an exacerbation of her asthma.  She states she has been using her inhaler, both albuterol and Symbicort, without any improvement in symptoms.  She states she uses Symbicort as directed which is daily twice a day.  This was prescribed by her primary care.   Past Medical History:  Diagnosis Date   ADD (attention deficit disorder)    Asthma    Migraine     There are no problems to display for this patient.   Past Surgical History:  Procedure Laterality Date   TONSILLECTOMY      OB History   No obstetric history on file.      Home Medications    Prior to Admission medications   Medication Sig Start Date End Date Taking? Authorizing Provider  albuterol (PROVENTIL) (2.5 MG/3ML) 0.083% nebulizer solution Inhale into the lungs. 09/20/17   [provider]  albuterol (VENTOLIN HFA) 108 (90 Base) MCG/ACT inhaler Two puffs every 6 hours 09/20/17   [provider]  amphetamine-dextroamphetamine (ADDERALL) 10 MG tablet Take by mouth. 01/13/20   [provider]  budesonide (PULMICORT) 0.5 MG/2ML nebulizer solution Use via nebulizer along with albuterol respule  twice a dayas needed 10/09/19   [provider]  EPINEPHrine 0.3 mg/0.3 mL IJ SOAJ injection Inject into the muscle. 10/01/19   [provider]  erythromycin ophthalmic ointment Place 1 Application into both eyes 3 (three) times daily. 11/18/21   Fisher, Linden Dolin, PA-C  ipratropium-albuterol (DUONEB) 0.5-2.5 (3) MG/3ML SOLN Take 3 mLs by nebulization every 4 (four) hours as needed. 02/07/20   Fisher, Linden Dolin, PA-C  predniSONE (STERAPRED UNI-PAK 21 TAB) 10 MG (21) TBPK tablet Take 6 pills on day one then decrease by 1 pill each day 11/18/21   Versie Starks, PA-C    Family History No family history on file.  Social History Social History   Tobacco Use   Smoking status: Former   Smokeless tobacco: Never  Scientific laboratory technician Use: Never used  Substance Use Topics   Alcohol use: Yes   Drug use: Yes    Types: Marijuana     Allergies   Barley grass, Corn-containing products, Food [peanut-containing drug products], Lactose, Other, and Uncaria tomentosa (cats claw)   Review of Systems Review of Systems  Respiratory:  Positive for shortness of breath.   Cardiovascular:  Positive for chest pain.     Physical Exam Triage Vital Signs ED Triage Vitals [04/25/22 0930]  Enc Vitals Group     BP 106/74     Pulse Rate 81     Resp 17     Temp 98.1 F (36.7 C)     Temp src      SpO2 97 %     Weight  Height      Head Circumference      Peak Flow      Pain Score      Pain Loc      Pain Edu?      Excl. in [place]?    No data found.  Updated Vital Signs BP 106/74   Pulse 81   Temp 98.1 F (36.7 C)   Resp 17   SpO2 97%   Visual Acuity Right Eye Distance:   Left Eye Distance:   Bilateral Distance:    Right Eye Near:   Left Eye Near:    Bilateral Near:     Physical Exam Vitals reviewed.  Constitutional:      Appearance: She is well-developed. She is not ill-appearing.  Cardiovascular:     Rate and Rhythm: Normal rate and regular rhythm.     Heart sounds: Normal heart sounds.  Pulmonary:     Effort: Pulmonary effort is normal.     Breath sounds: Normal breath sounds. No decreased breath sounds, wheezing,  rhonchi or rales.  Skin:    General: Skin is warm and dry.  Neurological:     General: No focal deficit present.     Mental Status: She is alert and oriented to person, place, and time.  Psychiatric:        Mood and Affect: Mood normal.        Behavior: Behavior normal.      UC Treatments / Results  Labs (all labs ordered are listed, but only abnormal results are displayed) Labs Reviewed - No data to display  EKG   Radiology No results found.  Procedures Procedures (including critical care time)  Medications Ordered in UC Medications - No data to display  Initial Impression / Assessment and Plan / UC Course  I have reviewed the triage vital signs and the nursing notes.  Pertinent labs & imaging results that were available during my care of the patient were reviewed by me and considered in my medical decision making (see chart for details).   Patient is afebrile here without recent antipyretics. Satting well on room air. Overall is well appearing, well hydrated, without respiratory distress. Pulmonary exam is unremarkable.  Lungs CTAB without wheezing, rhonchi, rales.  She has right-sided chest wall pain with palpation.  EKG here is reassuring.  Unclear etiology for her symptoms. Differential diagnosis includes costochondritis, GERD, PE, pneumonia, pneumothorax.  Recent chest imaging in the ED was WNL.  Recent D-dimer was within range, as were other lab values.  Most likely costochondritis and continue to recommend NSAID use for her symptoms.  Newly reported symptom of shortness of breath in the context of a reported asthma condition treated with albuterol and Symbicort.  Recommending continued use of these medications as needed and offered treatment for an exacerbation with a course of prednisone.  Final Clinical Impressions(s) / UC Diagnoses   Final diagnoses:  Unstable angina pectoris ([place])  Other chest pain   Discharge Instructions   None    ED Prescriptions    None    PDMP not reviewed this encounter.   Rose Phi, [place] 04/25/22 [phone]

## 2022-04-26 ENCOUNTER — Emergency Department
Admission: EM | Admit: 2022-04-26 | Discharge: 2022-04-26 | Disposition: A | Discharge status: Home / Self Care | Payer: BC Managed Care – PPO | Attending: Family Medicine | Admitting: Family Medicine

## 2022-04-26 ENCOUNTER — Emergency Department: Payer: BC Managed Care – PPO

## 2022-04-26 DIAGNOSIS — J45909 Unspecified asthma, uncomplicated: Secondary | ICD-10-CM | POA: Insufficient documentation

## 2022-04-26 DIAGNOSIS — Z1152 Encounter for screening for COVID-19: Secondary | ICD-10-CM | POA: Diagnosis not present

## 2022-04-26 DIAGNOSIS — R059 Cough, unspecified: Secondary | ICD-10-CM | POA: Diagnosis present

## 2022-04-26 DIAGNOSIS — J101 Influenza due to other identified influenza virus with other respiratory manifestations: Secondary | ICD-10-CM | POA: Insufficient documentation

## 2022-04-26 LAB — RESP PANEL BY RT-PCR (RSV, FLU A&B, COVID)  RVPGX2
Influenza A by PCR: POSITIVE — AB
Influenza B by PCR: NEGATIVE
Resp Syncytial Virus by PCR: NEGATIVE
SARS Coronavirus 2 by RT PCR: NEGATIVE

## 2022-04-26 MED ORDER — PSEUDOEPH-BROMPHEN-DM 30-2-10 MG/5ML PO SYRP: 5.0000 mL | ORAL_SOLUTION | Freq: Four times a day (QID) | ORAL | 0 refills | Status: AC | PRN

## 2022-04-26 MED ORDER — ALBUTEROL SULFATE (2.5 MG/3ML) 0.083% IN NEBU
2.5000 mg | INHALATION_SOLUTION | RESPIRATORY_TRACT | 0 refills | Status: AC | PRN
Start: 2022-04-26 — End: ?

## 2022-04-26 MED ORDER — ALBUTEROL SULFATE HFA 108 (90 BASE) MCG/ACT IN AERS
2.0000 | INHALATION_SPRAY | RESPIRATORY_TRACT | 1 refills | Status: AC | PRN
Start: 2022-04-26 — End: ?

## 2022-04-26 NOTE — ED Triage Notes (Signed)
Pt sts that she has been having a cough with chest congestion and fever. Pt sts that she has been feeling this way for the last four days.

## 2022-04-26 NOTE — Discharge Instructions (Addendum)
Follow up with Urgent Care or the [facility] if any continued problems.  Continue taking the prednisone until finished.  Use inhaler as needed.  Bromofed DM as needed for cough and congestion.

## 2022-04-26 NOTE — ED Notes (Signed)
Pt with cough and congestion x 4 days, states she was given steroids at urgent care yesterday but they have not helped.

## 2022-04-26 NOTE — ED Provider Notes (Signed)
[facility] Provider Note    None    (approximate)   History   Cough   HPI  Leslie Logan is a 22 y.o. female presents to the ED with complaint of cough, chest congestion and fever.  Patient also has a history of asthma and was seen at urgent care where she was prescribed prednisone.  Patient continues to cough and is feeling lousy.  She states that cough medication was not prescribed for her.     Physical Exam   Triage Vital Signs: ED Triage Vitals  Enc Vitals Group     BP 04/26/22 1329 122/87     Pulse Rate 04/26/22 1329 86     Resp 04/26/22 1329 17     Temp 04/26/22 1329 99.1 F (37.3 C)     Temp Source 04/26/22 1329 Oral     SpO2 04/26/22 1329 100 %     Weight 04/26/22 1330 135 lb (61.2 kg)     Height --      Head Circumference --      Peak Flow --      Pain Score 04/26/22 1329 8     Pain Loc --      Pain Edu? --      Excl. in [place]? --     Most recent vital signs: Vitals:   04/26/22 1329  BP: 122/87  Pulse: 86  Resp: 17  Temp: 99.1 F (37.3 C)  SpO2: 100%     General: Awake, no distress.  CV:  Good peripheral perfusion.  Heart regular rate and rhythm. Resp:  Normal effort.  Lungs are clear bilaterally. Abd:  No distention.  Other:     ED Results / Procedures / Treatments   Labs (all labs ordered are listed, but only abnormal results are displayed) Labs Reviewed  RESP PANEL BY RT-PCR (RSV, FLU A&B, COVID)  RVPGX2 - Abnormal; Notable for the following components:      Result Value   Influenza A by PCR POSITIVE (*)    All other components within normal limits     RADIOLOGY Chest x-ray was negative for acute cardiopulmonary changes.    PROCEDURES:  Critical Care performed:   Procedures   MEDICATIONS ORDERED IN ED: Medications - No data to display   IMPRESSION / MDM / [place] / ED COURSE  I reviewed the triage vital signs and the nursing notes.   Differential diagnosis includes,  but is not limited to, influenza, COVID, RSV, pneumonia, exacerbation of asthma, viral URI.  22 year old female presents to the ED with complaint of cough, congestion, fever and overall not feeling well.  She was seen at urgent care yesterday where she was prescribed prednisone which has helped with her breathing as she does have a history of asthma.  Patient was made aware that she tested positive for influenza A.  This is her fourth day of not feeling well.  A prescription for Bromfed DM was written for cough and congestion.  Refills of her albuterol inhaler and nebulizer solution was sent to the pharmacy as well.  She is to follow-up with [facility] or urgent care if any continued problems.  She is aware that she needs to return to the emergency department if any severe worsening of her symptoms such as difficulty breathing or shortness of breath.      Patient's presentation is most consistent with acute complicated illness / injury requiring diagnostic workup.  FINAL CLINICAL IMPRESSION(S) / ED  DIAGNOSES   Final diagnoses:  Influenza A     Rx / DC Orders   ED Discharge Orders          Ordered    brompheniramine-pseudoephedrine-DM 30-2-10 MG/5ML syrup  4 times daily PRN        04/26/22 1447    albuterol (PROVENTIL) (2.5 MG/3ML) 0.083% nebulizer solution  Every 4 hours PRN        04/26/22 1450    albuterol (VENTOLIN HFA) 108 (90 Base) MCG/ACT inhaler  Every 4 hours PRN        04/26/22 1450             Note:  This document was prepared using Dragon voice recognition software and may include unintentional dictation errors.   Johnn Hai, PA-C 04/26/22 1526    Delman Kitten, MD 04/26/22 [phone]

## 2022-04-27 ENCOUNTER — Other Ambulatory Visit: Payer: Self-pay

## 2022-04-27 ENCOUNTER — Emergency Department
Admission: EM | Admit: 2022-04-27 | Discharge: 2022-04-27 | Disposition: A | Discharge status: Home / Self Care | Payer: BC Managed Care – PPO | Attending: Emergency Medicine | Admitting: Emergency Medicine

## 2022-04-27 DIAGNOSIS — J101 Influenza due to other identified influenza virus with other respiratory manifestations: Secondary | ICD-10-CM | POA: Insufficient documentation

## 2022-04-27 DIAGNOSIS — J029 Acute pharyngitis, unspecified: Secondary | ICD-10-CM | POA: Diagnosis present

## 2022-04-27 DIAGNOSIS — E119 Type 2 diabetes mellitus without complications: Secondary | ICD-10-CM | POA: Insufficient documentation

## 2022-04-27 DIAGNOSIS — J111 Influenza due to unidentified influenza virus with other respiratory manifestations: Secondary | ICD-10-CM

## 2022-04-27 LAB — GROUP A STREP BY PCR: Group A Strep by PCR: NOT DETECTED

## 2022-04-27 MED ORDER — KETOROLAC TROMETHAMINE 15 MG/ML IJ SOLN
15.0000 mg | Freq: Once | INTRAMUSCULAR | Status: AC
  Administered 2022-04-27: 15 mg via INTRAMUSCULAR
  Filled 2022-04-27: qty 1

## 2022-04-27 NOTE — ED Triage Notes (Signed)
Pt to ED via POV c/o sore throat. Pt states she was diagnosed with flu yesterday and also had a sore throat. Pt states sore throat has not gotten worse but is still there. Pt in NAD at this time.

## 2022-04-27 NOTE — Discharge Instructions (Signed)
Your flu test yesterday was positive.  Your strep test today is negative.  You may continue to take Tylenol/ibuprofen per package instructions to help with your symptoms.  Please return for any new, worsening, or changing symptoms or other concerns.  It was a pleasure caring for you today.

## 2022-04-27 NOTE — ED Provider Notes (Signed)
[facility] Provider Note    Event Date/Time   First MD Initiated Contact with Patient 04/27/22 [phone]     (approximate)   History   Influenza and Sore Throat   HPI  Leslie Logan is a 22 y.o. female who was just diagnosed with influenza a yesterday presents today for continued symptoms.  She reports that she continues to have a cough and a sore throat and her cough medicine is not working.  She reports that the fevers have resolved.  No chest pain or shortness of breath.  No abdominal pain, nausea, vomiting, diarrhea.     Physical Exam   Triage Vital Signs: ED Triage Vitals  Enc Vitals Group     BP 04/27/22 0655 128/81     Pulse Rate 04/27/22 0655 77     Resp 04/27/22 0655 16     Temp 04/27/22 0655 98.2 F (36.8 C)     Temp Source 04/27/22 0655 Oral     SpO2 04/27/22 0655 97 %     Weight 04/27/22 0652 135 lb (61.2 kg)     Height 04/27/22 0652 5' (1.524 m)     Head Circumference --      Peak Flow --      Pain Score 04/27/22 0652 8     Pain Loc --      Pain Edu? --      Excl. in [place]? --     Most recent vital signs: Vitals:   04/27/22 0655 04/27/22 0813  BP: 128/81 120/80  Pulse: 77 70  Resp: 16 16  Temp: 98.2 F (36.8 C)   SpO2: 97% 98%    Physical Exam Vitals and nursing note reviewed.  Constitutional:      General: Awake and alert. No acute distress.    Appearance: Normal appearance. The patient is normal weight.  HENT:     Head: Normocephalic and atraumatic.     Mouth: Mucous membranes are moist. Uvula midline.  No tonsillar exudate.  No soft palate fluctuance.  No trismus.  No voice change.  No sublingual swelling.  No tender cervical lymphadenopathy.  No nuchal rigidity Eyes:     General: PERRL. Normal EOMs        Right eye: No discharge.        Left eye: No discharge.     Conjunctiva/sclera: Conjunctivae normal.  Cardiovascular:     Rate and Rhythm: Normal rate and regular rhythm.     Pulses: Normal pulses.      Heart sounds: Normal heart sounds Pulmonary:     Effort: Pulmonary effort is normal. No respiratory distress.     Breath sounds: Normal breath sounds.  Abdominal:     Abdomen is soft. There is no abdominal tenderness. No rebound or guarding. No distention. Musculoskeletal:        General: No swelling. Normal range of motion.     Cervical back: Normal range of motion and neck supple.  Skin:    General: Skin is warm and dry.     Capillary Refill: Capillary refill takes less than 2 seconds.     Findings: No rash.  Neurological:     Mental Status: The patient is awake and alert.      ED Results / Procedures / Treatments   Labs (all labs ordered are listed, but only abnormal results are displayed) Labs Reviewed  GROUP A STREP BY PCR     EKG     RADIOLOGY  PROCEDURES:  Critical Care performed:   Procedures   MEDICATIONS ORDERED IN ED: Medications  ketorolac (TORADOL) 15 MG/ML injection 15 mg (15 mg Intramuscular Given 04/27/22 0800)     IMPRESSION / MDM / ASSESSMENT AND PLAN / ED COURSE  I reviewed the triage vital signs and the nursing notes.   Differential diagnosis includes, but is not limited to, influenza, strep pharyngitis, other URI.  Patient is awake and alert, hemodynamically stable and afebrile.  She is nontoxic in appearance.    I reviewed the patient's chart.  She was seen in the emergency department and she was diagnosed with the flu yesterday.  Chest x-ray was negative for cardiopulmonary abnormality.  She was discharged with with brompheniramine-pseudoephedrine DM as well as albuterol inhaler.  Patient is able to speak easily in complete sentences.  She denies feeling short of breath.  There are no wheezes on exam.  Strep swab obtained in triage is negative for strep.  Her continued symptoms of cough, body aches, and sore throat are likely due to the flu that she was diagnosed with yesterday.  There is no uvular deviation, hot potato voice,  drooling, trismus, neck pain or stiffness to suggest peritonsillar or retropharyngeal abscess.  We discussed continued symptomatic management and return precautions.  Patient understands and agrees with plan.  She was discharged in stable condition.   Patient's presentation is most consistent with acute complicated illness / injury requiring diagnostic workup.     FINAL CLINICAL IMPRESSION(S) / ED DIAGNOSES   Final diagnoses:  Influenza     Rx / DC Orders   ED Discharge Orders     None        Note:  This document was prepared using Dragon voice recognition software and may include unintentional dictation errors.   [name] 04/27/22 5945    Harvest Dark, MD 04/27/22 1546

## 2022-05-02 ENCOUNTER — Emergency Department: Payer: BC Managed Care – PPO

## 2022-05-02 ENCOUNTER — Emergency Department
Admission: EM | Admit: 2022-05-02 | Discharge: 2022-05-02 | Disposition: A | Discharge status: Home / Self Care | Payer: BC Managed Care – PPO | Attending: Emergency Medicine | Admitting: Emergency Medicine

## 2022-05-02 ENCOUNTER — Other Ambulatory Visit: Payer: Self-pay

## 2022-05-02 DIAGNOSIS — J101 Influenza due to other identified influenza virus with other respiratory manifestations: Secondary | ICD-10-CM | POA: Diagnosis not present

## 2022-05-02 DIAGNOSIS — R0981 Nasal congestion: Secondary | ICD-10-CM | POA: Diagnosis present

## 2022-05-02 LAB — GROUP A STREP BY PCR: Group A Strep by PCR: NOT DETECTED

## 2022-05-02 MED ORDER — ACETAMINOPHEN 325 MG PO TABS
650.0000 mg | ORAL_TABLET | Freq: Once | ORAL | Status: AC
  Administered 2022-05-02: 650 mg via ORAL
  Filled 2022-05-02: qty 2

## 2022-05-02 NOTE — Discharge Instructions (Signed)
Your strep is negative. Your chest x-ray is normal. Continue to get plenty of rest, drink fluids, and take tylenol/motrin per package instructions as needed for aches. Return for new, worsening, or change in symptoms or other concerns.

## 2022-05-02 NOTE — ED Triage Notes (Signed)
T comes with c/o flu +/ pt states she was dx few  days ago here in ED. Pt states symptoms have gotten worse and so has her cough and congestion.

## 2022-05-02 NOTE — ED Provider Notes (Signed)
[facility] Provider Note    Event Date/Time   First MD Initiated Contact with Patient 05/02/22 1102     (approximate)   History   flu+   HPI  Leslie Logan is a 22 y.o. female who was diagnosed with influenza on 04/26/2022, and seen again on 04/27/2022 who presents today for evaluation of congestion and cough.  Patient reports that she has also developed a sore throat.  She denies fevers or chills.  No chest pain, shortness of breath, abdominal pain, nausea, vomiting, diarrhea.  She has not had any fevers or chills anymore.     Physical Exam   Triage Vital Signs: ED Triage Vitals  Enc Vitals Group     BP 05/02/22 1007 110/83     Pulse Rate 05/02/22 1007 (!) 106     Resp 05/02/22 1007 17     Temp 05/02/22 1007 98 F (36.7 C)     Temp src --      SpO2 05/02/22 1007 98 %     Weight --      Height --      Head Circumference --      Peak Flow --      Pain Score 05/02/22 1006 8     Pain Loc --      Pain Edu? --      Excl. in [place]? --     Most recent vital signs: Vitals:   05/02/22 1007  BP: 110/83  Pulse: (!) 106  Resp: 17  Temp: 98 F (36.7 C)  SpO2: 98%    Physical Exam Vitals and nursing note reviewed.  Constitutional:      General: Awake and alert. No acute distress.    Appearance: Normal appearance. The patient is normal weight.  HENT:     Head: Normocephalic and atraumatic.     Mouth: Mucous membranes are moist. Uvula midline.  No tonsillar exudate.  No soft palate fluctuance.  No trismus.  No voice change.  No sublingual swelling.  No tender cervical lymphadenopathy.  No nuchal rigidity Eyes:     General: PERRL. Normal EOMs        Right eye: No discharge.        Left eye: No discharge.     Conjunctiva/sclera: Conjunctivae normal.  Cardiovascular:     Rate and Rhythm: Normal rate and regular rhythm.     Pulses: Normal pulses.  Pulmonary:     Effort: Pulmonary effort is normal. No respiratory distress.     Breath  sounds: Normal breath sounds.  Able to speak easily in complete sentences Abdominal:     Abdomen is soft. There is no abdominal tenderness. No rebound or guarding. No distention. Musculoskeletal:        General: No swelling. Normal range of motion.     Cervical back: Normal range of motion and neck supple.  Skin:    General: Skin is warm and dry.     Capillary Refill: Capillary refill takes less than 2 seconds.     Findings: No rash.  Neurological:     Mental Status: The patient is awake and alert.      ED Results / Procedures / Treatments   Labs (all labs ordered are listed, but only abnormal results are displayed) Labs Reviewed  GROUP A STREP BY PCR     EKG     RADIOLOGY I independently reviewed and interpreted imaging and agree with radiologists findings.     PROCEDURES:  Critical Care performed:   Procedures   MEDICATIONS ORDERED IN ED: Medications  acetaminophen (TYLENOL) tablet 650 mg (650 mg Oral Given 05/02/22 1144)     IMPRESSION / MDM / ASSESSMENT AND PLAN / ED COURSE  I reviewed the triage vital signs and the nursing notes.   Differential diagnosis includes, but is not limited to, influenza, strep pharyngitis, pneumonia, bronchitis.  Patient is alert, hemodynamically stable and afebrile.  I reviewed the patient's chart.  She was diagnosed with influenza A on 04/26/2022.  She is afebrile, able to speak easily in complete sentences.  She is nontoxic in appearance.     Her uvula is midline, no tonsillar exudate, no trismus, do not suspect peritonsillar or retropharyngeal abscess.  Her strep swab is negative.  X-ray of her chest does not demonstrate pneumonia.  We discussed symptomatic management and return precautions.  Patient understands and agrees with plan.  She was discharged in stable condition.   Patient's presentation is most consistent with acute complicated illness / injury requiring diagnostic workup.      FINAL CLINICAL IMPRESSION(S) / ED  DIAGNOSES   Final diagnoses:  Influenza A     Rx / DC Orders   ED Discharge Orders     None        Note:  This document was prepared using Dragon voice recognition software and may include unintentional dictation errors.   Leslie Logan 05/02/22 1331    Vanessa Kulpmont, MD 05/03/22 [phone]

## 2022-05-14 ENCOUNTER — Ambulatory Visit
Admission: EM | Admit: 2022-05-14 | Discharge: 2022-05-14 | Disposition: A | Discharge status: Home / Self Care | Payer: BC Managed Care – PPO | Attending: Urgent Care | Admitting: Urgent Care

## 2022-05-14 DIAGNOSIS — R197 Diarrhea, unspecified: Secondary | ICD-10-CM | POA: Diagnosis not present

## 2022-05-14 DIAGNOSIS — R112 Nausea with vomiting, unspecified: Secondary | ICD-10-CM

## 2022-05-14 MED ORDER — ONDANSETRON 4 MG PO TBDP: 4.0000 mg | ORAL_TABLET | Freq: Three times a day (TID) | ORAL | 0 refills | Status: AC | PRN

## 2022-05-14 MED ORDER — CIPROFLOXACIN HCL 500 MG PO TABS: 500.0000 mg | ORAL_TABLET | Freq: Two times a day (BID) | ORAL | 0 refills | Status: DC

## 2022-05-14 NOTE — ED Provider Notes (Signed)
[facility]    CSN: [id number] Arrival date & time: 05/14/22  1253      History   Chief Complaint Chief Complaint  Patient presents with   Abdominal Pain   Emesis   Diarrhea    HPI Leslie Logan is a 22 y.o. female.    Abdominal Pain Associated symptoms: vomiting   Emesis Associated symptoms: abdominal pain    Patient presents to urgent care after 5 days of abdominal pain associated with diarrhea, nausea and vomiting.  She states she tested positive for influenza about 2 weeks ago and most symptoms resolved.  Last episode of vomiting was yesterday.  Episodes of diarrhea are "every time I eat".   Past Medical History:  Diagnosis Date   ADD (attention deficit disorder)    Asthma    Migraine     There are no problems to display for this patient.   Past Surgical History:  Procedure Laterality Date   TONSILLECTOMY      OB History   No obstetric history on file.      Home Medications    Prior to Admission medications   Medication Sig Start Date End Date Taking? Authorizing Provider  albuterol (PROVENTIL) (2.5 MG/3ML) 0.083% nebulizer solution Inhale 3 mLs (2.5 mg total) into the lungs every 4 (four) hours as needed for wheezing or shortness of breath. 04/26/22   Johnn Hai, PA-C  albuterol (VENTOLIN HFA) 108 (90 Base) MCG/ACT inhaler Inhale 2 puffs into the lungs every 4 (four) hours as needed for wheezing or shortness of breath. 04/26/22   Johnn Hai, PA-C  amphetamine-dextroamphetamine (ADDERALL) 10 MG tablet Take by mouth. 01/13/20   [provider]  brompheniramine-pseudoephedrine-DM 30-2-10 MG/5ML syrup Take 5 mLs by mouth 4 (four) times daily as needed. 04/26/22   Johnn Hai, PA-C  budesonide (PULMICORT) 0.5 MG/2ML nebulizer solution Use via nebulizer along with albuterol respule  twice a dayas needed 10/09/19   [provider]  EPINEPHrine 0.3 mg/0.3 mL IJ SOAJ injection Inject into the muscle.  10/01/19   [provider]  erythromycin ophthalmic ointment Place 1 Application into both eyes 3 (three) times daily. 11/18/21   Fisher, Linden Dolin, PA-C  ipratropium-albuterol (DUONEB) 0.5-2.5 (3) MG/3ML SOLN Take 3 mLs by nebulization every 4 (four) hours as needed. 02/07/20   Caryn Section Linden Dolin, PA-C    Family History History reviewed. No pertinent family history.  Social History Social History   Tobacco Use   Smoking status: Former   Smokeless tobacco: Never  Scientific laboratory technician Use: Never used  Substance Use Topics   Alcohol use: Yes   Drug use: Yes    Types: Marijuana     Allergies   Barley grass, Corn-containing products, Lactose, Other, Peanut-containing drug products, and Uncaria tomentosa (cats claw)   Review of Systems Review of Systems  Gastrointestinal:  Positive for abdominal pain and vomiting.     Physical Exam Triage Vital Signs ED Triage Vitals [05/14/22 1309]  Enc Vitals Group     BP 114/77     Pulse Rate 88     Resp 16     Temp 98.8 F (37.1 C)     Temp Source Oral     SpO2 98 %     Weight      Height      Head Circumference      Peak Flow      Pain Score      Pain Loc  Pain Edu?      Excl. in [place]?    No data found.  Updated Vital Signs BP 114/77 (BP Location: Left Arm)   Pulse 88   Temp 98.8 F (37.1 C) (Oral)   Resp 16   LMP 05/14/2022 (Approximate)   SpO2 98%   Visual Acuity Right Eye Distance:   Left Eye Distance:   Bilateral Distance:    Right Eye Near:   Left Eye Near:    Bilateral Near:     Physical Exam Vitals reviewed.  Constitutional:      Appearance: She is well-developed.  Cardiovascular:     Rate and Rhythm: Normal rate and regular rhythm.     Heart sounds: Normal heart sounds.  Pulmonary:     Effort: Pulmonary effort is normal.     Breath sounds: Normal breath sounds.  Abdominal:     General: Abdomen is flat. Bowel sounds are increased.     Palpations: Abdomen is soft.     Tenderness: There is  generalized abdominal tenderness.  Skin:    General: Skin is warm and dry.  Neurological:     Mental Status: She is alert.      UC Treatments / Results  Labs (all labs ordered are listed, but only abnormal results are displayed) Labs Reviewed - No data to display  EKG   Radiology No results found.  Procedures Procedures (including critical care time)  Medications Ordered in UC Medications - No data to display  Initial Impression / Assessment and Plan / UC Course  I have reviewed the triage vital signs and the nursing notes.  Pertinent labs & imaging results that were available during my care of the patient were reviewed by me and considered in my medical decision making (see chart for details).   Patient is afebrile here without recent antipyretics. Satting well on room air. Overall is well appearing, well hydrated, without respiratory distress. Pulmonary exam is unremarkable.  Lungs CTAB without wheezing, rhonchi, rales.  Abdomen is soft, flat with generalized tenderness.  Bowel sounds are increased.  Suspect initial viral allergy however given duration of symptoms, now concerned regarding bacterial involvement.  Will prescribe Cipro along with Zofran to help with nausea.  If diarrhea does not resolve after an additional 2 days, will recommend OTC Imodium.  Final Clinical Impressions(s) / UC Diagnoses   Final diagnoses:  None   Discharge Instructions   None    ED Prescriptions   None    PDMP not reviewed this encounter.   Rose Phi, [place] 05/14/22 1340

## 2022-05-14 NOTE — Discharge Instructions (Addendum)
I encourage you to drink fluids as often as possible to prevent dehydration.  Use Zofran to control your nausea.  I have prescribed an antibiotic to help with a possible bacterial cause of your symptoms.  If your diarrhea does not improve after another 2 days, recommend you obtain and start using OTC Imodium according to packaging directions.  Do not use this treatment if you have fever.  Follow up here or with your primary care provider if your symptoms are worsening or not improving with treatment.

## 2022-05-14 NOTE — ED Triage Notes (Signed)
Patient presents to UC for abdominal pain, diarrhea, and vomiting since last weds. Tested positive for flu. Not taking anything for symptom relief.

## 2022-08-08 ENCOUNTER — Other Ambulatory Visit: Payer: Self-pay

## 2022-08-08 ENCOUNTER — Emergency Department: Payer: BC Managed Care – PPO

## 2022-08-08 DIAGNOSIS — G43909 Migraine, unspecified, not intractable, without status migrainosus: Secondary | ICD-10-CM | POA: Diagnosis not present

## 2022-08-08 DIAGNOSIS — R519 Headache, unspecified: Secondary | ICD-10-CM | POA: Diagnosis present

## 2022-08-08 DIAGNOSIS — J45909 Unspecified asthma, uncomplicated: Secondary | ICD-10-CM | POA: Insufficient documentation

## 2022-08-08 DIAGNOSIS — R002 Palpitations: Secondary | ICD-10-CM | POA: Diagnosis not present

## 2022-08-08 LAB — COMPREHENSIVE METABOLIC PANEL
ALT: 30 U/L (ref 0–44)
AST: 25 U/L (ref 15–41)
Albumin: 4.5 g/dL (ref 3.5–5.0)
Alkaline Phosphatase: 62 U/L (ref 38–126)
Anion gap: 8 (ref 5–15)
BUN: 10 mg/dL (ref 6–20)
CO2: 26 mmol/L (ref 22–32)
Calcium: 9.5 mg/dL (ref 8.9–10.3)
Chloride: 103 mmol/L (ref 98–111)
Creatinine, Ser: 0.8 mg/dL (ref 0.44–1.00)
GFR, Estimated: >60 mL/min (ref >60)
Glucose, Bld: 88 mg/dL (ref 70–99)
Potassium: 3.8 mmol/L (ref 3.5–5.1)
Sodium: 137 mmol/L (ref 135–145)
Total Bilirubin: 1.8 mg/dL — ABNORMAL HIGH (ref 0.3–1.2)
Total Protein: 7.4 g/dL (ref 6.5–8.1)

## 2022-08-08 LAB — CBC WITH DIFFERENTIAL/PLATELET
Abs Immature Granulocytes: 0.02 10*3/uL (ref 0.00–0.07)
Basophils Absolute: 0 10*3/uL (ref 0.0–0.1)
Basophils Relative: 1 %
Eosinophils Absolute: 0.1 10*3/uL (ref 0.0–0.5)
Eosinophils Relative: 2 %
HCT: 43 % (ref 36.0–46.0)
Hemoglobin: 14.2 g/dL (ref 12.0–15.0)
Immature Granulocytes: 0 %
Lymphocytes Relative: 38 %
Lymphs Abs: 2.8 10*3/uL (ref 0.7–4.0)
MCH: 28.6 pg (ref 26.0–34.0)
MCHC: 33 g/dL (ref 30.0–36.0)
MCV: 86.7 fL (ref 80.0–100.0)
Monocytes Absolute: 0.5 10*3/uL (ref 0.1–1.0)
Monocytes Relative: 7 %
Neutro Abs: 3.7 10*3/uL (ref 1.7–7.7)
Neutrophils Relative %: 52 %
Platelets: 400 10*3/uL (ref 150–400)
RBC: 4.96 MIL/uL (ref 3.87–5.11)
RDW: 12.9 % (ref 11.5–15.5)
WBC: 7.2 10*3/uL (ref 4.0–10.5)
nRBC: 0 % (ref 0.0–0.2)

## 2022-08-08 LAB — HCG, QUANTITATIVE, PREGNANCY: hCG, Beta Chain, Quant, S: <1 m[IU]/mL (ref <5)

## 2022-08-08 LAB — TROPONIN I (HIGH SENSITIVITY): Troponin I (High Sensitivity): <2 ng/L (ref <18)

## 2022-08-08 NOTE — ED Notes (Signed)
Patient transported to CT 

## 2022-08-08 NOTE — ED Triage Notes (Signed)
Pt denies chest pain or pressure but is now reporting intermittent palpitations and cramping in neck.

## 2022-08-08 NOTE — ED Triage Notes (Signed)
Pt reports ice pick headache x 1 wk. Alternates sides but reports primarily on L temple. Reports hx of same and other types of migraines but that this pain is worse than typical. Also reports confusion today where pt had moments where she forgot what day it was and where she was/what she was doing. Pt currently alert and oriented following commands. Ambulatory to triage. Denies fall or head trauma. Pt denies parasthesia or vision change. Pt breathing unlabored speaking in full sentences. Symmetric chest rise and fall.

## 2022-08-09 ENCOUNTER — Emergency Department
Admission: EM | Admit: 2022-08-09 | Discharge: 2022-08-09 | Disposition: A | Discharge status: Home / Self Care | Payer: BC Managed Care – PPO | Attending: Emergency Medicine | Admitting: Emergency Medicine

## 2022-08-09 DIAGNOSIS — G43909 Migraine, unspecified, not intractable, without status migrainosus: Secondary | ICD-10-CM

## 2022-08-09 MED ORDER — KETOROLAC TROMETHAMINE 30 MG/ML IJ SOLN
30.0000 mg | Freq: Once | INTRAMUSCULAR | Status: AC
  Administered 2022-08-09: 30 mg via INTRAVENOUS
  Filled 2022-08-09: qty 1

## 2022-08-09 MED ORDER — SODIUM CHLORIDE 0.9 % IV BOLUS
1000.0000 mL | Freq: Once | INTRAVENOUS | Status: AC
  Administered 2022-08-09: 1000 mL via INTRAVENOUS

## 2022-08-09 MED ORDER — DIPHENHYDRAMINE HCL 50 MG/ML IJ SOLN
50.0000 mg | Freq: Once | INTRAMUSCULAR | Status: AC
  Administered 2022-08-09: 50 mg via INTRAVENOUS
  Filled 2022-08-09: qty 1

## 2022-08-09 MED ORDER — METOCLOPRAMIDE HCL 5 MG/ML IJ SOLN
10.0000 mg | Freq: Once | INTRAMUSCULAR | Status: AC
  Administered 2022-08-09: 10 mg via INTRAVENOUS
  Filled 2022-08-09: qty 2

## 2022-08-09 NOTE — ED Provider Notes (Signed)
[facility] Provider Note    Event Date/Time   First MD Initiated Contact with Patient 08/09/22 0008     (approximate)  History   Chief Complaint: Headache and Palpitations (/)  HPI  Leslie Logan is a 22 y.o. female with a past medical history of migraines, asthma, presents to the emergency department for headache.  According to the patient for the past 4 to 5 days she has been experiencing a headache mostly on the left side of her head.  Patient states a history of various types of headaches including migraines.  Patient states at times over the past week she feels like her memory is foggy, she also states at times she has been experiencing some palpitations of the chest although denies any chest pain at any point.  Denies any weakness or numbness in the arm or leg.  Patient states this feels like her prior headaches although somewhat more sharp in nature at times.  Physical Exam   Triage Vital Signs: ED Triage Vitals [08/08/22 2132]  Enc Vitals Group     BP (!) 154/105     Pulse Rate (!) 106     Resp 18     Temp 98.1 F (36.7 C)     Temp Source Oral     SpO2 100 %     Weight 136 lb (61.7 kg)     Height 5' (1.524 m)     Head Circumference      Peak Flow      Pain Score 9     Pain Loc      Pain Edu?      Excl. in [place]?     Most recent vital signs: Vitals:   08/08/22 2132  BP: (!) 154/105  Pulse: (!) 106  Resp: 18  Temp: 98.1 F (36.7 C)  SpO2: 100%    General: Awake, no distress.  CV:  Good peripheral perfusion.  Regular rate and rhythm  Resp:  Normal effort.  Equal breath sounds bilaterally.  Abd:  No distention.  Soft, nontender.  No rebound or guarding.  ED Results / Procedures / Treatments   EKG  I have reviewed and interpreted the EKG.  EKG shows sinus tachycardia 113 bpm with a narrow QRS, normal axis, normal intervals, no concerning ST changes.  RADIOLOGY  I have reviewed and interpreted CT head images.  No  bleed seen on my evaluation. Radiology has read the CT scan as negative. Chest x-ray is negative.   MEDICATIONS ORDERED IN ED: Medications  ketorolac (TORADOL) 30 MG/ML injection 30 mg (has no administration in time range)  metoCLOPramide (REGLAN) injection 10 mg (has no administration in time range)  diphenhydrAMINE (BENADRYL) injection 50 mg (has no administration in time range)  sodium chloride 0.9 % bolus 1,000 mL (has no administration in time range)     IMPRESSION / MDM / ASSESSMENT AND PLAN / ED COURSE  I reviewed the triage vital signs and the nursing notes.  Patient's presentation is most consistent with acute presentation with potential threat to life or bodily function.  Patient presents emergency department for headache over the last for 5 days consistent with prior migraines although somewhat sharper in nature per patient.  Patient denies any neurologic symptoms.  Does states she has been having intermittent palpitations.  Reassuringly patient's lab work is normal with a normal CBC, normal chemistry including normal renal function.  Negative troponin and a negative pregnancy test.  Chest x-ray is clear CT  scan of the head is normal.  Reassuring physical exam reassuring vital signs besides mild hypertension and tachycardia although the patient does appear anxious.  Discussed options with the patient we will proceed with a migraine cocktail medications and IV fluids.  Will reassess after medications.  Patient agreeable to plan of care.  Patient states she is feeling much better after medications.  Given the patient's reassuring workup we will discharge home.  Provided my typical migraine return precautions.  FINAL CLINICAL IMPRESSION(S) / ED DIAGNOSES   Migraine headache    Note:  This document was prepared using Dragon voice recognition software and may include unintentional dictation errors.   Minna Antis, MD 08/09/22 [phone]

## 2022-08-09 NOTE — ED Notes (Signed)
Patient resting quietly with eyes closed.  Respirations even and unlabored.  °

## 2022-10-16 IMAGING — MR MR PELVIS WO/W CM
25 series · 48 of 48 positions shown · IV contrast (gadavist)
Comparison: Pelvic ultrasound 07/12/2020, CT abdomen pelvis
07/11/2020, chest radiograph 07/15/2020

CLINICAL DATA: Seen twice for UTI symptoms, right lower abdominal
pain, now with fever of 101.9* F

EXAM:
MRI ABDOMEN AND PELVIS WITHOUT AND WITH CONTRAST
TECHNIQUE: Multiplanar multisequence MR imaging of the abdomen and pelvis was
performed both before and after the administration of intravenous
contrast.
CONTRAST:  7.5mL GADAVIST GADOBUTROL 1 MMOL/ML IV SOLN

[Series 7: T2 · coronal · 6.0mm · 1.00mm/px · 2 of 30 slices shown (1 of 5)]
[im 1/30]
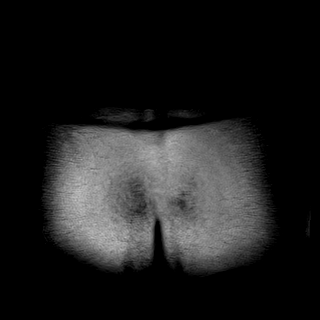
[im 30/30]
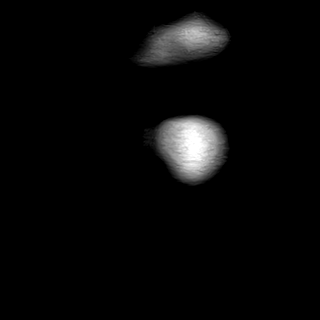

[Series 8: T2 · axial · 6.0mm · 1.12mm/px · z∈[-278,-69]mm · 2 of 30 slices shown (2 of 5)]
[im 1/30]
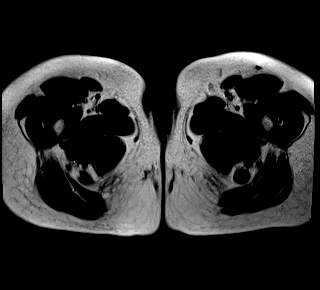
[im 30/30]
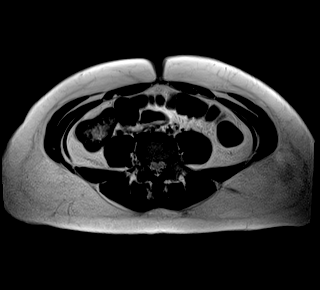

[Series 9: T2 · sagittal · 6.0mm · 1.00mm/px · 2 of 40 slices shown (3 of 5)]
[im 1/40]
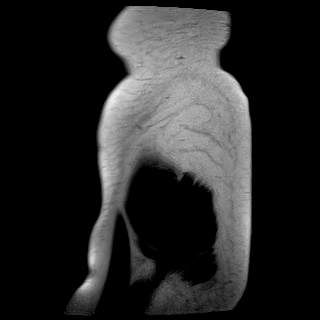
[im 40/40]
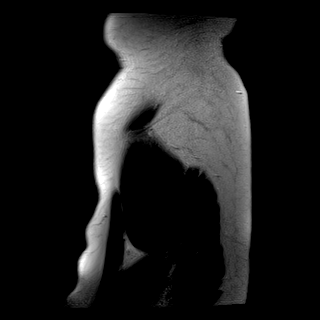

[Series 14: T2 · coronal · 6.0mm · 1.12mm/px · 2 of 30 slices shown (4 of 5)]
[im 1/30]
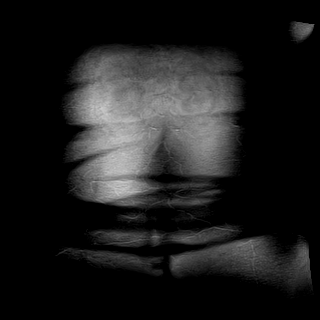
[im 30/30]
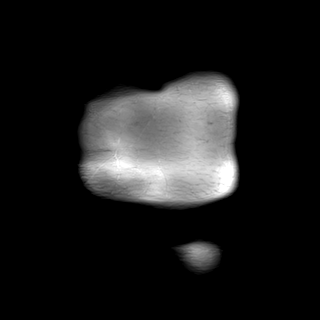

[Series 15: T2 · axial · 6.0mm · 1.19mm/px · z∈[-45,+164]mm · 2 of 30 slices shown (5 of 5)]
[im 1/30]
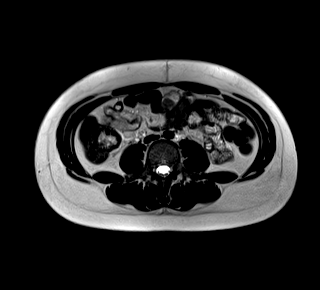
[im 30/30]
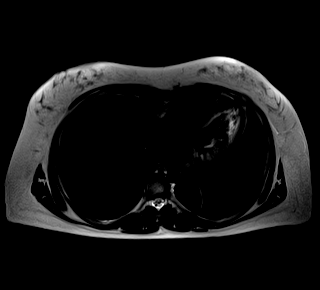

[Series 16: bSSFP · axial · 6.0mm · 0.74mm/px · 1 of 30 slices shown]
[im 1/30]
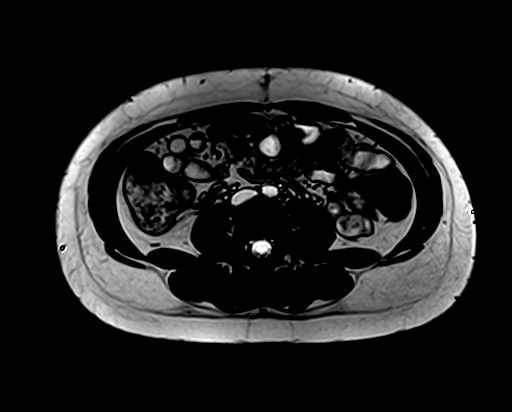

[Series 18: T2 fat-sat · axial · 6.0mm · 1.19mm/px · 1 of 30 slices shown]
[im 1/30]
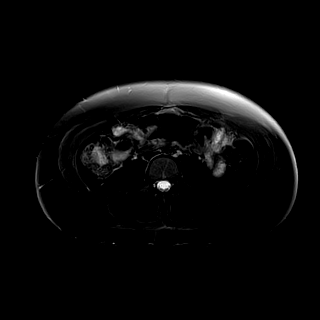

[Series 19: ax dwi_tracew · axial · 6.0mm · 1.42mm/px · z∈[-59,+179]mm · 3 of 102 slices shown]
[im 1/102]
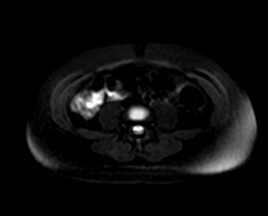
[im 51/102]
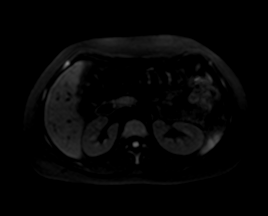
[im 102/102]
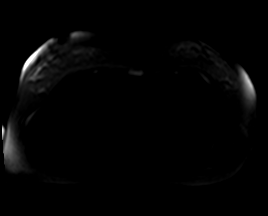

[Series 20: ax dwi_adc · axial · 6.0mm · 1.42mm/px · 1 of 34 slices shown]
[im 1/34]
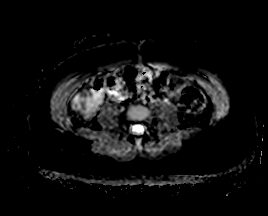

[Series 21: T1 fat-sat · axial · 3.0mm · 1.12mm/px · z∈[-262,-25]mm · 2 of 80 slices shown (1 of 7)]
[im 1/80]
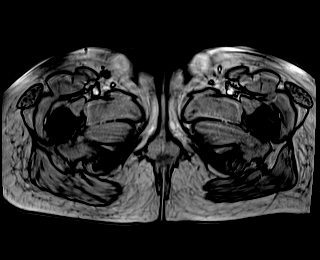
[im 80/80]
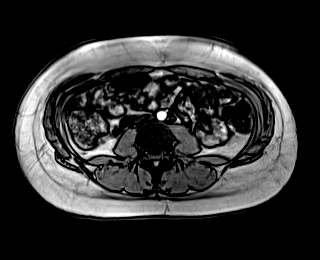

[Series 22: T1 fat-sat · axial · 3.0mm · 1.12mm/px · z∈[-262,-25]mm · 2 of 80 slices shown (2 of 7)]
[im 1/80]
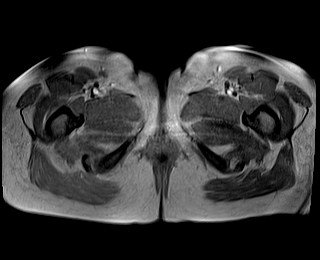
[im 80/80]
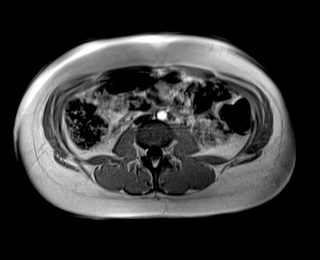

[Series 24: T1 fat-sat · axial · 3.0mm · 1.12mm/px · z∈[-262,-25]mm · 2 of 80 slices shown (3 of 7)]
[im 1/80]
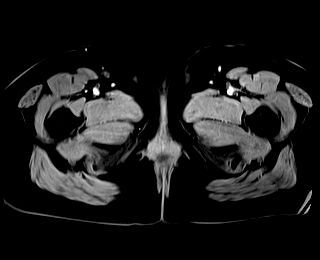
[im 80/80]
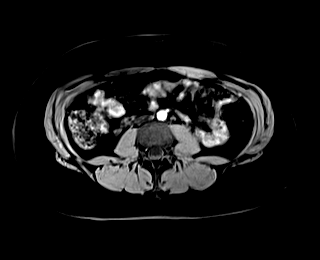

[Series 25: T1 fat-sat · axial · non-contrast · 3.0mm · 1.19mm/px · z∈[-47,+166]mm · 2 of 72 slices shown (4 of 7)]
[im 1/72]
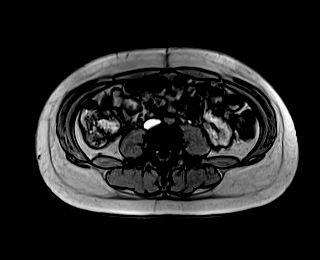
[im 72/72]
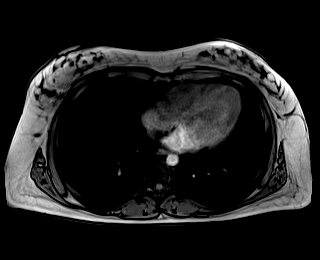

[Series 26: T1 fat-sat · axial · non-contrast · 3.0mm · 1.19mm/px · z∈[-47,+166]mm · 2 of 72 slices shown (5 of 7)]
[im 1/72]
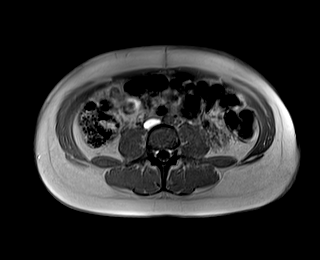
[im 72/72]
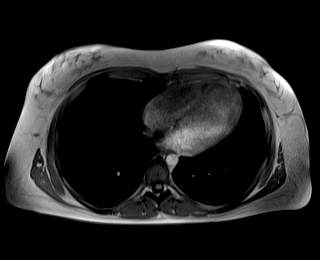

[Series 28: T1 fat-sat · axial · non-contrast · 3.0mm · 1.19mm/px · z∈[-47,+166]mm · 2 of 72 slices shown (6 of 7)]
[im 1/72]
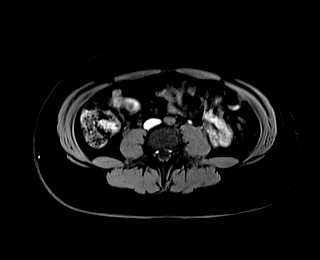
[im 72/72]
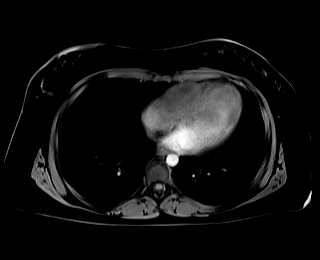

[Series 31: T1 dynamic fat-sat post-contrast · axial · 3.0mm · 1.19mm/px · z∈[-47,+166]mm · 2 of 72 slices shown (1 of 8)]
[im 1/72]
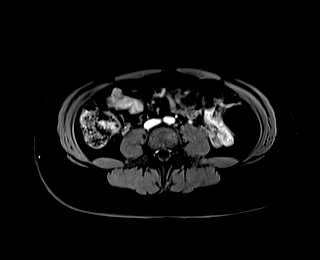
[im 72/72]
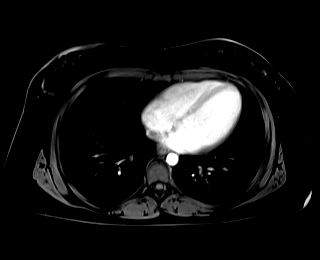

[Series 32: T1 dynamic fat-sat post-contrast · axial · 3.0mm · 1.19mm/px · z∈[-47,+166]mm · 2 of 72 slices shown (2 of 8)]
[im 1/72]
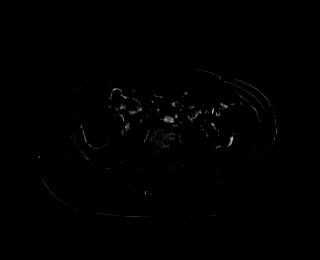
[im 72/72]
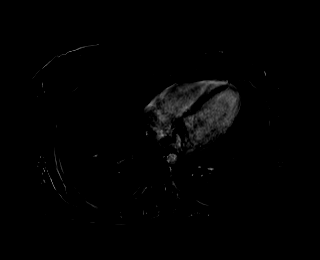

[Series 35: T1 dynamic fat-sat post-contrast · axial · 3.0mm · 1.19mm/px · z∈[-47,+166]mm · 2 of 72 slices shown (3 of 8)]
[im 1/72]
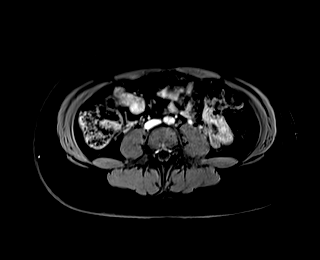
[im 72/72]
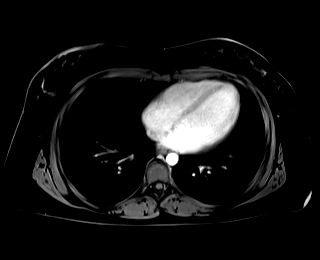

[Series 36: T1 dynamic fat-sat post-contrast · axial · 3.0mm · 1.19mm/px · z∈[-47,+166]mm · 2 of 72 slices shown (4 of 8)]
[im 1/72]
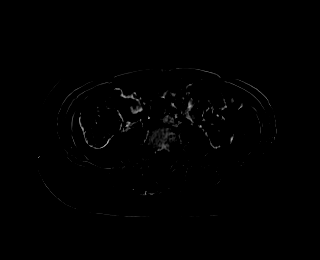
[im 72/72]
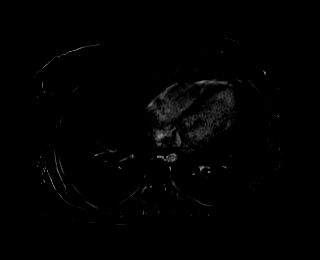

[Series 39: T1 dynamic fat-sat post-contrast · axial · 3.0mm · 1.19mm/px · z∈[-47,+166]mm · 2 of 72 slices shown (5 of 8)]
[im 1/72]
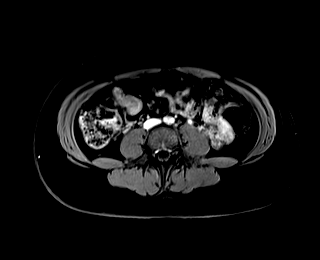
[im 72/72]
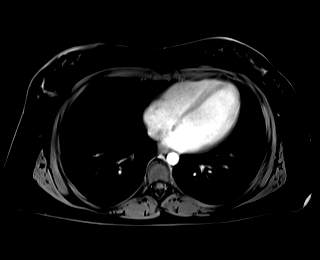

[Series 40: T1 dynamic fat-sat post-contrast · axial · 3.0mm · 1.19mm/px · z∈[-47,+166]mm · 2 of 72 slices shown (6 of 8)]
[im 1/72]
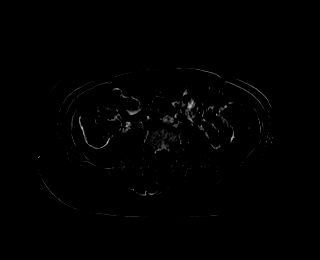
[im 72/72]
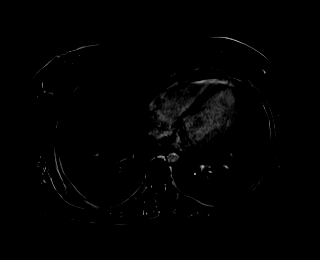

[Series 41: T1 dynamic post-contrast · coronal · 3.0mm · 1.31mm/px · 2 of 72 slices shown]
[im 1/72]
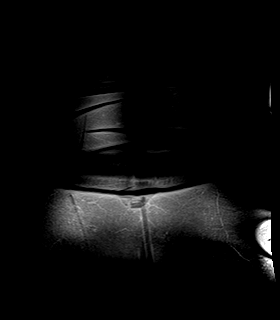
[im 72/72]
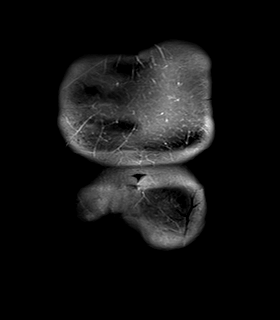

[Series 44: T1 dynamic fat-sat post-contrast · axial · 3.0mm · 1.19mm/px · z∈[-47,+166]mm · 2 of 72 slices shown (7 of 8)]
[im 1/72]
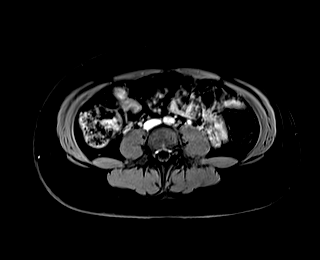
[im 72/72]
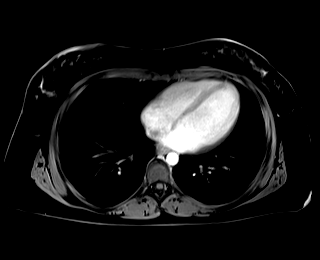

[Series 45: T1 dynamic fat-sat post-contrast · axial · 3.0mm · 1.19mm/px · z∈[-47,+166]mm · 2 of 72 slices shown (8 of 8)]
[im 1/72]
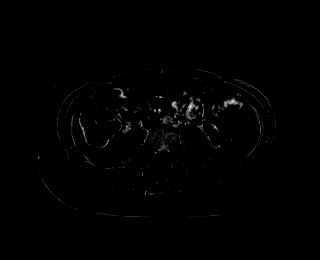
[im 72/72]
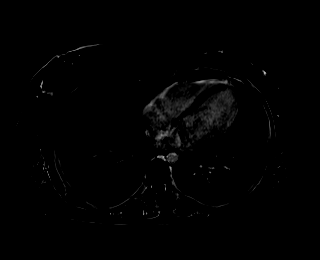

[Series 47: T1 fat-sat · axial · 3.0mm · 1.12mm/px · z∈[-262,-25]mm · 2 of 80 slices shown (7 of 7)]
[im 1/80]
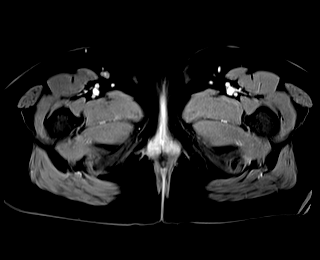
[im 80/80]
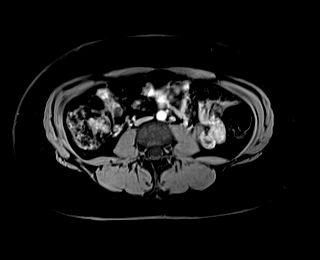

[48 of 48 positions shown; findings below may reference images not displayed]

FINDINGS: COMBINED FINDINGS FOR BOTH MR ABDOMEN AND PELVIS

Lower chest: Lung bases are clear. Normal heart size. No pericardial
effusion.

Hepatobiliary: Normal intrinsic liver signal. No significant signal
dropout on out of phase imaging. Tiny 7 mm right lobe hepatic cyst.
No concerning focal liver lesion. Unremarkable appearance of the
gallbladder and biliary tree.

Pancreas: No mass, inflammatory changes, or other parenchymal
abnormality identified.

Spleen:  Normal in size. No concerning splenic lesions.

Adrenals/Urinary Tract: Unremarkable MR appearance of the adrenal
glands. No concerning renal lesions. No obstructive uropathy or
hydronephrosis. No acute abnormality of the urinary bladder.

Stomach/Bowel: Distal esophagus, stomach and duodenum are
unremarkable. No abnormally thickened or enhancing loops of small
bowel. Normal caliber appendix is seen in the right lower quadrant
coursing towards midline. No appreciable periappendiceal
inflammation, fluid or collection. No evidence of bowel obstruction.

Vascular/Lymphatic: No pathologically enlarged lymph nodes
identified. No abdominal aortic aneurysm demonstrated.

Reproductive: Anteverted uterus with an arcuate configuration of the
uterine fundus ([DATE]). No concerning adnexal masses or lesions with
normal follicles seen in both ovaries.

Other:  No abdominopelvic free fluid.  No bowel containing hernias.

Musculoskeletal: Normal marrow signal within the spine and bony
pelvis. Grossly normal cord signal.
IMPRESSION: 1. Normal caliber appendix. No appreciable periappendiceal
inflammation, fluid or collection.
2. No other acute MR evident abnormalities in the abdomen or pelvis
to provide a cause for patient's symptoms.
3. Arcuate configuration of the uterine fundus.

## 2023-01-05 ENCOUNTER — Ambulatory Visit
Admission: EM | Admit: 2023-01-05 | Discharge: 2023-01-05 | Disposition: A | Discharge status: Home / Self Care | Payer: BC Managed Care – PPO | Attending: Emergency Medicine | Admitting: Emergency Medicine

## 2023-01-05 ENCOUNTER — Other Ambulatory Visit: Payer: Self-pay

## 2023-01-05 ENCOUNTER — Encounter: Payer: Self-pay | Admitting: Emergency Medicine

## 2023-01-05 DIAGNOSIS — J020 Streptococcal pharyngitis: Secondary | ICD-10-CM

## 2023-01-05 LAB — POCT RAPID STREP A (OFFICE): Rapid Strep A Screen: POSITIVE — AB

## 2023-01-05 MED ORDER — AZITHROMYCIN 250 MG PO TABS: 250.0000 mg | ORAL_TABLET | Freq: Every day | ORAL | 0 refills | Status: AC

## 2023-01-05 MED ORDER — AMOXICILLIN 500 MG PO CAPS: 500.0000 mg | ORAL_CAPSULE | Freq: Two times a day (BID) | ORAL | 0 refills | Status: DC

## 2023-01-05 NOTE — Discharge Instructions (Addendum)
Take the Zithromax as directed for strep throat.  Follow up with your primary care provider if your symptoms are not improving.    

## 2023-01-05 NOTE — ED Provider Notes (Signed)
Renaldo Fiddler    CSN: [id number] Arrival date & time: 01/05/23  1219      History   Chief Complaint Chief Complaint  Patient presents with   Sore Throat    HPI Leslie Logan is a 22 y.o. female.  Patient presents with low-grade fever and sore throat x 3 days.  Treating with Tylenol and ibuprofen.  No rash, cough, shortness of breath, or other symptoms.  Her medical history includes tonsillectomy, asthma, migraine headache, ADD.  The history is provided by the patient and medical records.    Past Medical History:  Diagnosis Date   ADD (attention deficit disorder)    Asthma    Migraine     There are no problems to display for this patient.   Past Surgical History:  Procedure Laterality Date   TONSILLECTOMY      OB History   No obstetric history on file.      Home Medications    Prior to Admission medications   Medication Sig Start Date End Date Taking? Authorizing Provider  azithromycin (ZITHROMAX) 250 MG tablet Take 1 tablet (250 mg total) by mouth daily. Take first 2 tablets together, then 1 every day until finished. 01/05/23  Yes Mickie Bail, NP  albuterol (PROVENTIL) (2.5 MG/3ML) 0.083% nebulizer solution Inhale 3 mLs (2.5 mg total) into the lungs every 4 (four) hours as needed for wheezing or shortness of breath. 04/26/22   Tommi Rumps, PA-C  albuterol (VENTOLIN HFA) 108 (90 Base) MCG/ACT inhaler Inhale 2 puffs into the lungs every 4 (four) hours as needed for wheezing or shortness of breath. 04/26/22   Tommi Rumps, PA-C  amphetamine-dextroamphetamine (ADDERALL) 10 MG tablet Take by mouth. 01/13/20   [provider]  brompheniramine-pseudoephedrine-DM 30-2-10 MG/5ML syrup Take 5 mLs by mouth 4 (four) times daily as needed. 04/26/22   Tommi Rumps, PA-C  budesonide (PULMICORT) 0.5 MG/2ML nebulizer solution Use via nebulizer along with albuterol respule  twice a dayas needed 10/09/19   [provider]   EPINEPHrine 0.3 mg/0.3 mL IJ SOAJ injection Inject into the muscle. 10/01/19   [provider]  erythromycin ophthalmic ointment Place 1 Application into both eyes 3 (three) times daily. 11/18/21   Fisher, Roselyn Bering, PA-C  ipratropium-albuterol (DUONEB) 0.5-2.5 (3) MG/3ML SOLN Take 3 mLs by nebulization every 4 (four) hours as needed. 02/07/20   Fisher, Roselyn Bering, PA-C  ondansetron (ZOFRAN-ODT) 4 MG disintegrating tablet Take 1 tablet (4 mg total) by mouth every 8 (eight) hours as needed for nausea or vomiting. 05/14/22   Immordino, Jeannett Senior, FNP    Family History History reviewed. No pertinent family history.  Social History Social History   Tobacco Use   Smoking status: Former   Smokeless tobacco: Never  Advertising account planner   Vaping status: Never Used  Substance Use Topics   Alcohol use: Yes   Drug use: Yes    Types: Marijuana     Allergies   Barley grass, Corn-containing products, Lactose, Other, Peanut-containing drug products, and Uncaria tomentosa (cats claw)   Review of Systems Review of Systems  Constitutional:  Positive for fever. Negative for chills.  HENT:  Positive for sore throat. Negative for ear pain.   Respiratory:  Negative for cough and shortness of breath.   Skin:  Negative for color change and rash.     Physical Exam Triage Vital Signs ED Triage Vitals  Encounter Vitals Group     BP 01/05/23 1230 111/72  Systolic BP Percentile --      Diastolic BP Percentile --      Pulse Rate 01/05/23 1230 (!) 102     Resp 01/05/23 1230 18     Temp 01/05/23 1230 98.4 F (36.9 C)     Temp src --      SpO2 01/05/23 1230 96 %     Weight 01/05/23 1234 139 lb (63 kg)     Height 01/05/23 1234 5' (1.524 m)     Head Circumference --      Peak Flow --      Pain Score 01/05/23 1233 8     Pain Loc --      Pain Education --      Exclude from Growth Chart --    No data found.  Updated Vital Signs BP 111/72   Pulse (!) 102   Temp 98.4 F (36.9 C)   Resp 18   Ht 5'  (1.524 m)   Wt 139 lb (63 kg)   LMP 12/18/2022   SpO2 96%   BMI 27.15 kg/m   Visual Acuity Right Eye Distance:   Left Eye Distance:   Bilateral Distance:    Right Eye Near:   Left Eye Near:    Bilateral Near:     Physical Exam Constitutional:      General: She is not in acute distress. HENT:     Right Ear: Tympanic membrane normal.     Left Ear: Tympanic membrane normal.     Nose: Nose normal.     Mouth/Throat:     Mouth: Mucous membranes are moist.     Pharynx: Posterior oropharyngeal erythema present.  Cardiovascular:     Rate and Rhythm: Normal rate and regular rhythm.     Heart sounds: Normal heart sounds.  Pulmonary:     Effort: Pulmonary effort is normal. No respiratory distress.     Breath sounds: Normal breath sounds.  Skin:    General: Skin is warm and dry.  Neurological:     Mental Status: She is alert.  Psychiatric:        Mood and Affect: Mood normal.        Behavior: Behavior normal.      UC Treatments / Results  Labs (all labs ordered are listed, but only abnormal results are displayed) Labs Reviewed  POCT RAPID STREP A (OFFICE) - Abnormal; Notable for the following components:      Result Value   Rapid Strep A Screen Positive (*)    All other components within normal limits    EKG   Radiology No results found.  Procedures Procedures (including critical care time)  Medications Ordered in UC Medications - No data to display  Initial Impression / Assessment and Plan / UC Course  I have reviewed the triage vital signs and the nursing notes.  Pertinent labs & imaging results that were available during my care of the patient were reviewed by me and considered in my medical decision making (see chart for details).    Strep pharyngitis.  Rapid strep positive.  Treating with Zithromax as patient reports that amoxicillin does not work on her strep throat.  Patient telephoned her mother who reports Zithromax is the antibiotic that is  effective for this patient.  Tylenol or ibuprofen as needed for discomfort.  Education provided on strep throat.  Instructed patient to follow up with her PCP if her symptoms are not improving.  She agrees to plan of care.  Final Clinical Impressions(s) / UC Diagnoses   Final diagnoses:  Strep pharyngitis     Discharge Instructions      Take the Zithromax as directed for strep throat.  Follow-up with your primary care provider if your symptoms are not improving.      ED Prescriptions     Medication Sig Dispense Auth. Provider   amoxicillin (AMOXIL) 500 MG capsule  (Status: Discontinued) Take 1 capsule (500 mg total) by mouth 2 (two) times daily for 10 days. 20 capsule Mickie Bail, NP   azithromycin (ZITHROMAX) 250 MG tablet Take 1 tablet (250 mg total) by mouth daily. Take first 2 tablets together, then 1 every day until finished. 6 tablet Mickie Bail, NP      PDMP not reviewed this encounter.   Mickie Bail, NP 01/05/23 1255

## 2023-01-05 NOTE — ED Triage Notes (Signed)
Pt started with sore throat 3 days ago but worse over last 2 days.  +odynophagia. Tmax 100.5 at home.  Has been using advil/tylenol.

## 2023-02-18 ENCOUNTER — Ambulatory Visit: Payer: BC Managed Care – PPO

## 2023-02-18 ENCOUNTER — Emergency Department: Payer: BC Managed Care – PPO

## 2023-02-18 ENCOUNTER — Other Ambulatory Visit: Payer: Self-pay

## 2023-02-18 ENCOUNTER — Emergency Department
Admission: EM | Admit: 2023-02-18 | Discharge: 2023-02-18 | Disposition: A | Discharge status: Home / Self Care | Payer: BC Managed Care – PPO | Attending: Emergency Medicine | Admitting: Emergency Medicine

## 2023-02-18 DIAGNOSIS — B349 Viral infection, unspecified: Secondary | ICD-10-CM | POA: Diagnosis not present

## 2023-02-18 DIAGNOSIS — R509 Fever, unspecified: Secondary | ICD-10-CM

## 2023-02-18 DIAGNOSIS — J45909 Unspecified asthma, uncomplicated: Secondary | ICD-10-CM | POA: Diagnosis not present

## 2023-02-18 DIAGNOSIS — Z1152 Encounter for screening for COVID-19: Secondary | ICD-10-CM | POA: Insufficient documentation

## 2023-02-18 DIAGNOSIS — R000 Tachycardia, unspecified: Secondary | ICD-10-CM | POA: Insufficient documentation

## 2023-02-18 DIAGNOSIS — R0602 Shortness of breath: Secondary | ICD-10-CM | POA: Diagnosis present

## 2023-02-18 LAB — CBC
HCT: 43.6 % (ref 36.0–46.0)
Hemoglobin: 14.4 g/dL (ref 12.0–15.0)
MCH: 29 pg (ref 26.0–34.0)
MCHC: 33 g/dL (ref 30.0–36.0)
MCV: 87.9 fL (ref 80.0–100.0)
Platelets: 144 10*3/uL — ABNORMAL LOW (ref 150–400)
RBC: 4.96 MIL/uL (ref 3.87–5.11)
RDW: 13 % (ref 11.5–15.5)
WBC: 7.7 10*3/uL (ref 4.0–10.5)
nRBC: 0 % (ref 0.0–0.2)

## 2023-02-18 LAB — BASIC METABOLIC PANEL
Anion gap: 7 (ref 5–15)
BUN: 9 mg/dL (ref 6–20)
CO2: 27 mmol/L (ref 22–32)
Calcium: 8.7 mg/dL — ABNORMAL LOW (ref 8.9–10.3)
Chloride: 102 mmol/L (ref 98–111)
Creatinine, Ser: 0.79 mg/dL (ref 0.44–1.00)
GFR, Estimated: >60 mL/min (ref >60)
Glucose, Bld: 131 mg/dL — ABNORMAL HIGH (ref 70–99)
Potassium: 3.4 mmol/L — ABNORMAL LOW (ref 3.5–5.1)
Sodium: 136 mmol/L (ref 135–145)

## 2023-02-18 LAB — RESP PANEL BY RT-PCR (RSV, FLU A&B, COVID)  RVPGX2
Influenza A by PCR: NEGATIVE
Influenza B by PCR: NEGATIVE
Resp Syncytial Virus by PCR: NEGATIVE
SARS Coronavirus 2 by RT PCR: NEGATIVE

## 2023-02-18 LAB — TROPONIN I (HIGH SENSITIVITY)
Troponin I (High Sensitivity): <2 ng/L (ref <18)
Troponin I (High Sensitivity): <2 ng/L (ref <18)

## 2023-02-18 MED ORDER — IPRATROPIUM-ALBUTEROL 0.5-2.5 (3) MG/3ML IN SOLN
3.0000 mL | Freq: Once | RESPIRATORY_TRACT | Status: AC
  Administered 2023-02-18: 3 mL via RESPIRATORY_TRACT
  Filled 2023-02-18: qty 3

## 2023-02-18 MED ORDER — PROCHLORPERAZINE MALEATE 5 MG PO TABS
5.0000 mg | ORAL_TABLET | Freq: Once | ORAL | Status: DC
  Filled 2023-02-18: qty 1

## 2023-02-18 MED ORDER — DEXAMETHASONE 4 MG PO TABS
10.0000 mg | ORAL_TABLET | Freq: Once | ORAL | Status: AC
Start: 2023-02-18 — End: 2023-02-18
  Administered 2023-02-18: 10 mg via ORAL
  Filled 2023-02-18: qty 3

## 2023-02-18 MED ORDER — PROCHLORPERAZINE MALEATE 10 MG PO TABS
5.0000 mg | ORAL_TABLET | Freq: Once | ORAL | Status: AC
  Administered 2023-02-18: 5 mg via ORAL
  Filled 2023-02-18: qty 1

## 2023-02-18 MED ORDER — ONDANSETRON HCL 4 MG PO TABS: 4.0000 mg | ORAL_TABLET | Freq: Three times a day (TID) | ORAL | 0 refills | Status: AC | PRN

## 2023-02-18 MED ORDER — KETOROLAC TROMETHAMINE 15 MG/ML IJ SOLN
15.0000 mg | Freq: Once | INTRAMUSCULAR | Status: AC
Start: 2023-02-18 — End: 2023-02-18
  Administered 2023-02-18: 15 mg via INTRAVENOUS

## 2023-02-18 MED ORDER — ACETAMINOPHEN 500 MG PO TABS
1000.0000 mg | ORAL_TABLET | Freq: Once | ORAL | Status: AC
  Administered 2023-02-18: 1000 mg via ORAL
  Filled 2023-02-18: qty 2

## 2023-02-18 MED ORDER — KETOROLAC TROMETHAMINE 15 MG/ML IJ SOLN
30.0000 mg | Freq: Once | INTRAMUSCULAR | Status: DC
  Filled 2023-02-18: qty 2

## 2023-02-18 NOTE — ED Provider Notes (Signed)
[facility] Provider Note    Event Date/Time   First MD Initiated Contact with Patient 02/18/23 0745     (approximate)   History   Chest Pain, Shortness of Breath, and Migraine   HPI Leslie Logan is a 22 y.o. female with history of asthma, ADHD presenting today for chest pain and shortness of breath.  Patient states she woke up in the middle of the night with some chest tightness and shortness of breath.  She has had a dry cough, headache, and nasal congestion.  Intermittent body aches as well.  Otherwise no vomiting, abdominal pain, dysuria, leg swelling.  Does report improvement in symptoms with her albuterol inhaler.  Had COVID several months ago which felt similar.     Physical Exam   Triage Vital Signs: ED Triage Vitals  Encounter Vitals Group     BP 02/18/23 0200 120/76     Systolic BP Percentile --      Diastolic BP Percentile --      Pulse Rate 02/18/23 0200 (!) 128     Resp 02/18/23 0200 18     Temp 02/18/23 0200 99.4 F (37.4 C)     Temp Source 02/18/23 0200 Oral     SpO2 02/18/23 0200 100 %     Weight 02/18/23 0508 140 lb (63.5 kg)     Height 02/18/23 0508 5' (1.524 m)     Head Circumference --      Peak Flow --      Pain Score 02/18/23 0206 6     Pain Loc --      Pain Education --      Exclude from Growth Chart --     Most recent vital signs: Vitals:   02/18/23 0823 02/18/23 0919  BP:  (!) 92/56  Pulse:  100  Resp:  16  Temp: (!) 101.8 F (38.8 C) 99.7 F (37.6 C)  SpO2:  97%   Physical Exam: I have reviewed the vital signs and nursing notes. General: Awake, alert, no acute distress.  Nontoxic appearing. Head:  Atraumatic, normocephalic.   ENT:  EOM intact, PERRL. Oral mucosa is pink and moist with no lesions. Neck: Neck is supple with full range of motion, No meningeal signs. Cardiovascular:  RRR, No murmurs. Peripheral pulses palpable and equal bilaterally. Respiratory:  Symmetrical chest wall expansion.   No rhonchi, rales, or wheezes.  Good air movement throughout.  No use of accessory muscles.   Musculoskeletal:  No cyanosis or edema. Moving extremities with full ROM Abdomen:  Soft, nontender, nondistended. Neuro:  GCS 15, moving all four extremities, interacting appropriately. Speech clear. Psych:  Calm, appropriate.   Skin:  Warm, dry, no rash.    ED Results / Procedures / Treatments   Labs (all labs ordered are listed, but only abnormal results are displayed) Labs Reviewed  BASIC METABOLIC PANEL - Abnormal; Notable for the following components:      Result Value   Potassium 3.4 (*)    Glucose, Bld 131 (*)    Calcium 8.7 (*)    All other components within normal limits  CBC - Abnormal; Notable for the following components:   Platelets 144 (*)    All other components within normal limits  RESP PANEL BY RT-PCR (RSV, FLU A&B, COVID)  RVPGX2  POC URINE PREG, ED  TROPONIN I (HIGH SENSITIVITY)  TROPONIN I (HIGH SENSITIVITY)     EKG My EKG interpretation: Rate of 118, sinus tachycardia, normal axis, normal  intervals.  No acute ST elevations or depressions   RADIOLOGY Independently interpreted chest x-ray with no acute pathology   PROCEDURES:  Critical Care performed: No  Procedures   MEDICATIONS ORDERED IN ED: Medications  acetaminophen (TYLENOL) tablet 1,000 mg (1,000 mg Oral Given 02/18/23 0824)  ipratropium-albuterol (DUONEB) 0.5-2.5 (3) MG/3ML nebulizer solution 3 mL (3 mLs Nebulization Given 02/18/23 0828)  prochlorperazine (COMPAZINE) tablet 5 mg (5 mg Oral Given 02/18/23 0824)  ketorolac (TORADOL) 15 MG/ML injection 15 mg (15 mg Intravenous Given 02/18/23 0833)  dexamethasone (DECADRON) tablet 10 mg (10 mg Oral Given 02/18/23 0950)     IMPRESSION / MDM / ASSESSMENT AND PLAN / ED COURSE  I reviewed the triage vital signs and the nursing notes.                              Differential diagnosis includes, but is not limited to, viral URI, bacterial  pneumonia, low suspicion ACS  Patient's presentation is most consistent with acute complicated illness / injury requiring diagnostic workup.  Patient is a 22 year old female presenting today for chest tightness and shortness of breath.  Initially tachycardic on arrival.  Patient then developed a fever most concerning for bacterial pneumonia versus viral URI.  Exam with no overt wheezing but given her history of asthma will trial DuoNeb.  Otherwise reassuring exam elsewhere.  No leg swelling or leg tenderness.  No recent long travel.  Troponin negative.  EKG without signs of ischemia.  CBC and BMP unremarkable.  Chest x-ray shows no evidence of bacterial pneumonia.  Negative for COVID, flu, and RSV.  Patient did note significant symptomatic improvement following DuoNeb.  She also had improvement in her headache symptoms as well after Compazine, Tylenol, and Toradol.  Improvement in vital signs as well and no longer afebrile.  Suspect most likely that this is a viral infection causing her symptoms given the body aches, fever, and cough.  Patient comfortable with being discharged at this time.  No ongoing emergent processes.  Low suspicion for PE as it does not seem consistent with her symptoms at this time.  Patient instructed on symptomatic care and given strict return precautions for worsening symptoms.  The patient is on the cardiac monitor to evaluate for evidence of arrhythmia and/or significant heart rate changes. Clinical Course as of 02/18/23 0959  Mon Feb 18, 2023  6962 Reassessed patient.  Feeling significantly better at this time.  Will discharge with strict return precautions. [DW]    Clinical Course User Index [DW] Janith Lima, MD     FINAL CLINICAL IMPRESSION(S) / ED DIAGNOSES   Final diagnoses:  Viral illness  Fever, unspecified fever cause     Rx / DC Orders   ED Discharge Orders          Ordered    Ambulatory Referral to Primary Care (Establish Care)        02/18/23  0944    ondansetron (ZOFRAN) 4 MG tablet  Every 8 hours PRN        02/18/23 0944             Note:  This document was prepared using Dragon voice recognition software and may include unintentional dictation errors.   Janith Lima, MD 02/18/23 1001

## 2023-02-18 NOTE — ED Triage Notes (Signed)
Pt reports migraine headache, shortness of breath intermittently and chest tightness. Pt reports choking sensation when lying down.

## 2023-02-18 NOTE — Discharge Instructions (Signed)
You can continue using albuterol inhaler, Tylenol, naproxen as needed to help with your symptoms.  Please return if you have severe worsening shortness of breath or other concerning symptoms.

## 2023-02-21 ENCOUNTER — Other Ambulatory Visit: Payer: Self-pay

## 2023-02-21 ENCOUNTER — Emergency Department
Admission: EM | Admit: 2023-02-21 | Discharge: 2023-02-21 | Disposition: A | Discharge status: Home / Self Care | Payer: BC Managed Care – PPO | Attending: Emergency Medicine | Admitting: Emergency Medicine

## 2023-02-21 ENCOUNTER — Encounter: Payer: Self-pay | Admitting: Emergency Medicine

## 2023-02-21 DIAGNOSIS — Z20822 Contact with and (suspected) exposure to covid-19: Secondary | ICD-10-CM | POA: Insufficient documentation

## 2023-02-21 DIAGNOSIS — R509 Fever, unspecified: Secondary | ICD-10-CM | POA: Insufficient documentation

## 2023-02-21 DIAGNOSIS — R0981 Nasal congestion: Secondary | ICD-10-CM | POA: Diagnosis not present

## 2023-02-21 DIAGNOSIS — R059 Cough, unspecified: Secondary | ICD-10-CM | POA: Insufficient documentation

## 2023-02-21 DIAGNOSIS — R519 Headache, unspecified: Secondary | ICD-10-CM | POA: Insufficient documentation

## 2023-02-21 DIAGNOSIS — M542 Cervicalgia: Secondary | ICD-10-CM | POA: Diagnosis not present

## 2023-02-21 DIAGNOSIS — J111 Influenza due to unidentified influenza virus with other respiratory manifestations: Secondary | ICD-10-CM

## 2023-02-21 LAB — CBC WITH DIFFERENTIAL/PLATELET
Abs Immature Granulocytes: 0.04 10*3/uL (ref 0.00–0.07)
Basophils Absolute: 0 10*3/uL (ref 0.0–0.1)
Basophils Relative: 1 %
Eosinophils Absolute: 0.2 10*3/uL (ref 0.0–0.5)
Eosinophils Relative: 3 %
HCT: 37.1 % (ref 36.0–46.0)
Hemoglobin: 12.6 g/dL (ref 12.0–15.0)
Immature Granulocytes: 1 %
Lymphocytes Relative: 24 %
Lymphs Abs: 1.9 10*3/uL (ref 0.7–4.0)
MCH: 29.2 pg (ref 26.0–34.0)
MCHC: 34 g/dL (ref 30.0–36.0)
MCV: 85.9 fL (ref 80.0–100.0)
Monocytes Absolute: 0.8 10*3/uL (ref 0.1–1.0)
Monocytes Relative: 10 %
Neutro Abs: 5 10*3/uL (ref 1.7–7.7)
Neutrophils Relative %: 61 %
Platelets: 149 10*3/uL — ABNORMAL LOW (ref 150–400)
RBC: 4.32 MIL/uL (ref 3.87–5.11)
RDW: 13.1 % (ref 11.5–15.5)
WBC: 8 10*3/uL (ref 4.0–10.5)
nRBC: 0 % (ref 0.0–0.2)

## 2023-02-21 LAB — RESP PANEL BY RT-PCR (RSV, FLU A&B, COVID)  RVPGX2
Influenza A by PCR: NEGATIVE
Influenza B by PCR: NEGATIVE
Resp Syncytial Virus by PCR: NEGATIVE
SARS Coronavirus 2 by RT PCR: NEGATIVE

## 2023-02-21 LAB — BASIC METABOLIC PANEL
Anion gap: 12 (ref 5–15)
BUN: 11 mg/dL (ref 6–20)
CO2: 22 mmol/L (ref 22–32)
Calcium: 8.6 mg/dL — ABNORMAL LOW (ref 8.9–10.3)
Chloride: 99 mmol/L (ref 98–111)
Creatinine, Ser: 0.57 mg/dL (ref 0.44–1.00)
GFR, Estimated: >60 mL/min (ref >60)
Glucose, Bld: 102 mg/dL — ABNORMAL HIGH (ref 70–99)
Potassium: 3.6 mmol/L (ref 3.5–5.1)
Sodium: 133 mmol/L — ABNORMAL LOW (ref 135–145)

## 2023-02-21 LAB — PROCALCITONIN: Procalcitonin: <0.1 ng/mL

## 2023-02-21 MED ORDER — METOCLOPRAMIDE HCL 10 MG PO TABS
10.0000 mg | ORAL_TABLET | Freq: Once | ORAL | Status: AC
Start: 2023-02-21 — End: 2023-02-21
  Administered 2023-02-21: 10 mg via ORAL
  Filled 2023-02-21: qty 1

## 2023-02-21 MED ORDER — DIPHENHYDRAMINE HCL 25 MG PO CAPS
25.0000 mg | ORAL_CAPSULE | Freq: Once | ORAL | Status: AC
Start: 2023-02-21 — End: 2023-02-21
  Administered 2023-02-21: 25 mg via ORAL
  Filled 2023-02-21: qty 1

## 2023-02-21 MED ORDER — KETOROLAC TROMETHAMINE 10 MG PO TABS
10.0000 mg | ORAL_TABLET | Freq: Once | ORAL | Status: AC
  Administered 2023-02-21: 10 mg via ORAL
  Filled 2023-02-21: qty 1

## 2023-02-21 MED ORDER — IPRATROPIUM-ALBUTEROL 0.5-2.5 (3) MG/3ML IN SOLN
3.0000 mL | Freq: Once | RESPIRATORY_TRACT | Status: AC
  Administered 2023-02-21: 3 mL via RESPIRATORY_TRACT
  Filled 2023-02-21: qty 3

## 2023-02-21 MED ORDER — ALBUTEROL SULFATE HFA 108 (90 BASE) MCG/ACT IN AERS: 2.0000 | INHALATION_SPRAY | RESPIRATORY_TRACT | 0 refills | Status: AC | PRN

## 2023-02-21 NOTE — ED Provider Notes (Signed)
[facility] Provider Note    Event Date/Time   First MD Initiated Contact with Patient 02/21/23 [phone]     (approximate)   History   Chief Complaint: Fever and Headache   HPI  Leslie Logan is a 22 y.o. female with a past history of migraines who comes to the ED complaining of intermittent fever for the past 5 days along with neck pain and headache which feels consistent with a typical migraine headache for her.  She has taken Tylenol off and on including this morning.  No vomiting or diarrhea.  Has occasional cough but no shortness of breath.  Also complains of sore throat and nasal congestion.  Pinpointing the pain, patient demonstrates the cervical trapezius musculature bilaterally        Physical Exam   Triage Vital Signs: ED Triage Vitals  Encounter Vitals Group     BP 02/21/23 0536 104/64     Systolic BP Percentile --      Diastolic BP Percentile --      Pulse Rate 02/21/23 0536 90     Resp 02/21/23 0536 18     Temp 02/21/23 0536 98.6 F (37 C)     Temp Source 02/21/23 0536 Oral     SpO2 02/21/23 0533 98 %     Weight 02/21/23 0538 138 lb 14.2 oz (63 kg)     Height 02/21/23 0538 5' (1.524 m)     Head Circumference --      Peak Flow --      Pain Score 02/21/23 0538 9     Pain Loc --      Pain Education --      Exclude from Growth Chart --     Most recent vital signs: Vitals:   02/21/23 0533 02/21/23 0536  BP:  104/64  Pulse:  90  Resp:  18  Temp:  98.6 F (37 C)  SpO2: 98% 94%    General: Awake, no distress.  Normal mental status CV:  Good peripheral perfusion.  Regular rate rhythm Resp:  Normal effort.  Abd:  No distention.  Other:  No midline C-spine tenderness.  There is mild tenderness in bilateral trapezius musculature reproducing her pain.  Full range of motion of the cervical spine with flexion extension bilateral rotation and jolt accentuation without pain. Oropharynx exam reveals moist oral mucosa, no  tonsillar swelling or exudate, no significant pharyngeal erythema or swelling.  External canals and TMs are normal bilaterally.  No cervical or occipital lymphadenopathy.   ED Results / Procedures / Treatments   Labs (all labs ordered are listed, but only abnormal results are displayed) Labs Reviewed  CBC WITH DIFFERENTIAL/PLATELET - Abnormal; Notable for the following components:      Result Value   Platelets 149 (*)    All other components within normal limits  BASIC METABOLIC PANEL - Abnormal; Notable for the following components:   Sodium 133 (*)    Glucose, Bld 102 (*)    Calcium 8.6 (*)    All other components within normal limits  RESP PANEL BY RT-PCR (RSV, FLU A&B, COVID)  RVPGX2  PROCALCITONIN     EKG    RADIOLOGY    PROCEDURES:  Procedures   MEDICATIONS ORDERED IN ED: Medications  ketorolac (TORADOL) tablet 10 mg (10 mg Oral Given 02/21/23 0740)  metoCLOPramide (REGLAN) tablet 10 mg (10 mg Oral Given 02/21/23 0740)  diphenhydrAMINE (BENADRYL) capsule 25 mg (25 mg Oral Given 02/21/23 0740)  ipratropium-albuterol (  DUONEB) 0.5-2.5 (3) MG/3ML nebulizer solution 3 mL (3 mLs Nebulization Given 02/21/23 0835)     IMPRESSION / MDM / ASSESSMENT AND PLAN / ED COURSE  I reviewed the triage vital signs and the nursing notes.  Patient's presentation is most consistent with acute illness / injury with system symptoms.  Patient presents with headache, neck pain for the past 5 days.  Presentations consistent with a viral URI, without evidence of meningitis or encephalitis.  Exam is reassuring, and I doubt pneumonia, bacterial sinusitis, PTA, RPA, or otitis.   Clinical Course as of 02/21/23 0939  Thu Feb 21, 2023  1610 Labs normal. Stable for DC once sx improved.  [PS]  [id number] Feeling better. Procal negative. Stable for DC [PS]    Clinical Course User Index [PS] Sharman Cheek, MD     FINAL CLINICAL IMPRESSION(S) / ED DIAGNOSES   Final diagnoses:   Influenza-like illness     Rx / DC Orders   ED Discharge Orders          Ordered    albuterol (PROVENTIL HFA) 108 (90 Base) MCG/ACT inhaler  Every 4 hours PRN        02/21/23 0939             Note:  This document was prepared using Dragon voice recognition software and may include unintentional dictation errors.   Sharman Cheek, MD 02/21/23 [phone]

## 2023-02-21 NOTE — ED Triage Notes (Signed)
Patient wheeled to triage via [facility] after being brought in tonight via [facility] for continued HA and Fevers off and on. Patient also endorses ear pain now and stiff neck as well. States when she was previously seen they told her she had some respiratory infection, no meds prescribed, and now feels worse than before.

## 2023-02-21 NOTE — ED Triage Notes (Signed)
EMS brings pt in from home for c/o fever & HA; seen recently for same (975mg  tylenol admin enroute)

## 2023-08-24 DIAGNOSIS — J45909 Unspecified asthma, uncomplicated: Secondary | ICD-10-CM | POA: Insufficient documentation

## 2023-08-24 DIAGNOSIS — T7840XA Allergy, unspecified, initial encounter: Secondary | ICD-10-CM | POA: Diagnosis present

## 2023-08-25 ENCOUNTER — Emergency Department
Admission: EM | Admit: 2023-08-25 | Discharge: 2023-08-25 | Disposition: A | Discharge status: Home / Self Care | Attending: Emergency Medicine | Admitting: Emergency Medicine

## 2023-08-25 ENCOUNTER — Encounter: Payer: Self-pay | Admitting: Emergency Medicine

## 2023-08-25 ENCOUNTER — Other Ambulatory Visit: Payer: Self-pay

## 2023-08-25 DIAGNOSIS — T7840XA Allergy, unspecified, initial encounter: Secondary | ICD-10-CM

## 2023-08-25 MED ORDER — METHYLPREDNISOLONE SODIUM SUCC 125 MG IJ SOLR
125.0000 mg | Freq: Once | INTRAMUSCULAR | Status: AC
  Administered 2023-08-25: 125 mg via INTRAVENOUS
  Filled 2023-08-25: qty 2

## 2023-08-25 MED ORDER — DIPHENHYDRAMINE HCL 50 MG/ML IJ SOLN
50.0000 mg | Freq: Once | INTRAMUSCULAR | Status: AC
  Administered 2023-08-25: 50 mg via INTRAVENOUS
  Filled 2023-08-25: qty 1

## 2023-08-25 MED ORDER — EPINEPHRINE 0.3 MG/0.3ML IJ SOAJ: 0.3000 mg | INTRAMUSCULAR | 1 refills | Status: AC | PRN

## 2023-08-25 MED ORDER — PREDNISONE 10 MG (21) PO TBPK: ORAL_TABLET | ORAL | 0 refills | Status: AC

## 2023-08-25 MED ORDER — SODIUM CHLORIDE 0.9 % IV BOLUS (SEPSIS)
1000.0000 mL | Freq: Once | INTRAVENOUS | Status: AC
Start: 2023-08-25 — End: 2023-08-25
  Administered 2023-08-25: 1000 mL via INTRAVENOUS

## 2023-08-25 MED ORDER — FAMOTIDINE IN NACL 20-0.9 MG/50ML-% IV SOLN
20.0000 mg | Freq: Once | INTRAVENOUS | Status: AC
  Administered 2023-08-25: 20 mg via INTRAVENOUS
  Filled 2023-08-25: qty 50

## 2023-08-25 MED ORDER — DIPHENHYDRAMINE HCL 50 MG PO TABS: 50.0000 mg | ORAL_TABLET | Freq: Three times a day (TID) | ORAL | 0 refills | Status: AC | PRN

## 2023-08-25 MED ORDER — FAMOTIDINE 20 MG PO TABS: 20.0000 mg | ORAL_TABLET | Freq: Two times a day (BID) | ORAL | 0 refills | Status: AC

## 2023-08-25 NOTE — Discharge Instructions (Signed)
 You may use Benadryl  50 mg every 8 hours as needed for itching, rash, throat discomfort.  If you have symptoms of difficulty swallowing, speaking or breathing like you did tonight, please return to the emergency department immediately.

## 2023-08-25 NOTE — ED Triage Notes (Signed)
 Patient presents for allergic reaction. Reports itching and body feeling hot since this morning. Patient states that she now feels like her tongue feels "fuller than normal" and throat feels itchy.  No known exposure to allergens. No meds PTA.

## 2023-08-25 NOTE — ED Notes (Signed)
 Pt reports throat is feeling itchy again... Denies [facility]

## 2023-08-25 NOTE — ED Provider Notes (Signed)
 [facility] Provider Note    Event Date/Time   First MD Initiated Contact with Patient 08/25/23 0024     (approximate)   History   Allergic Reaction   HPI  Leslie Logan is a 23 y.o. female  with history of allergies who presents to the emergency department concerns that she is having allergic reaction.  Her throat is getting tight and her tongue feels abnormal.  She also describes a red rash that is itching on her bilateral upper extremities.  No new exposures.  No difficulty talking, speaking or breathing.  No medications taken prior to arrival.   History provided by patient, significant other.    Past Medical History:  Diagnosis Date   ADD (attention deficit disorder)    Asthma    Migraine     Past Surgical History:  Procedure Laterality Date   TONSILLECTOMY      MEDICATIONS:  Prior to Admission medications   Medication Sig Start Date End Date Taking? Authorizing Provider  albuterol  (PROVENTIL  HFA) 108 (90 Base) MCG/ACT inhaler Inhale 2 puffs into the lungs every 4 (four) hours as needed for wheezing or shortness of breath. 02/21/23   Jacquie Maudlin, MD  albuterol  (PROVENTIL ) (2.5 MG/3ML) 0.083% nebulizer solution Inhale 3 mLs (2.5 mg total) into the lungs every 4 (four) hours as needed for wheezing or shortness of breath. 04/26/22   Stafford Eagles, PA-C  albuterol  (VENTOLIN  HFA) 108 (90 Base) MCG/ACT inhaler Inhale 2 puffs into the lungs every 4 (four) hours as needed for wheezing or shortness of breath. 04/26/22   Stafford Eagles, PA-C  amphetamine-dextroamphetamine (ADDERALL) 10 MG tablet Take by mouth. 01/13/20   [provider]  azithromycin  (ZITHROMAX ) 250 MG tablet Take 1 tablet (250 mg total) by mouth daily. Take first 2 tablets together, then 1 every day until finished. 01/05/23   Wellington Half, NP  brompheniramine-pseudoephedrine-DM 30-2-10 MG/5ML syrup Take 5 mLs by mouth 4 (four) times daily as needed.  04/26/22   Stafford Eagles, PA-C  budesonide (PULMICORT) 0.5 MG/2ML nebulizer solution Use via nebulizer along with albuterol  respule  twice a dayas needed 10/09/19   [provider]  EPINEPHrine  0.3 mg/0.3 mL IJ SOAJ injection Inject into the muscle. 10/01/19   [provider]  erythromycin  ophthalmic ointment Place 1 Application into both eyes 3 (three) times daily. 11/18/21   Fisher, Rufino Coulter, PA-C  ipratropium-albuterol  (DUONEB) 0.5-2.5 (3) MG/3ML SOLN Take 3 mLs by nebulization every 4 (four) hours as needed. 02/07/20   Fisher, Rufino Coulter, PA-C  ondansetron  (ZOFRAN ) 4 MG tablet Take 1 tablet (4 mg total) by mouth every 8 (eight) hours as needed. 02/18/23 02/18/24  Kandee Orion, MD  ondansetron  (ZOFRAN -ODT) 4 MG disintegrating tablet Take 1 tablet (4 mg total) by mouth every 8 (eight) hours as needed for nausea or vomiting. 05/14/22   Immordino, Mara [place], FNP    Physical Exam   Triage Vital Signs: ED Triage Vitals  Encounter Vitals Group     BP 08/25/23 0012 135/78     Systolic BP Percentile --      Diastolic BP Percentile --      Pulse Rate 08/25/23 0012 92     Resp 08/25/23 0012 16     Temp 08/25/23 0012 98 F (36.7 C)     Temp Source 08/25/23 0012 Oral     SpO2 08/25/23 0012 100 %     Weight 08/25/23 0011 134 lb (60.8 kg)  Height 08/25/23 0011 5' 5.5" (1.664 m)     Head Circumference --      Peak Flow --      Pain Score 08/25/23 0011 0     Pain Loc --      Pain Education --      Exclude from Growth Chart --     Most recent vital signs: Vitals:   08/25/23 0200 08/25/23 0258  BP: 116/78   Pulse:    Resp:  17  Temp:  98 F (36.7 C)  SpO2:  100%    CONSTITUTIONAL: Alert, responds appropriately to questions. Well-appearing; well-nourished HEAD: Normocephalic, atraumatic EYES: Conjunctivae clear, pupils appear equal, sclera nonicteric ENT: normal nose; moist mucous membranes, airway patent, no tongue or lip swelling, normal phonation, no trismus or  drooling, no stridor, no uvular swelling, no tonsillar hypertrophy or exudate NECK: Supple, normal ROM CARD: RRR; S1 and S2 appreciated RESP: Normal chest excursion without splinting or tachypnea; breath sounds clear and equal bilaterally; no wheezes, no rhonchi, no rales, no hypoxia or respiratory distress, speaking full sentences ABD/GI: Non-distended; soft, non-tender, no rebound, no guarding, no peritoneal signs BACK: The back appears normal EXT: Normal ROM in all joints; no deformity noted, no edema SKIN: Normal color for age and race; warm; mild increased redness without warmth to her bilateral upper extremities, patient also has a small hive to her right posterior thigh NEURO: Moves all extremities equally, normal speech PSYCH: The patient's mood and manner are appropriate.   ED Results / Procedures / Treatments   LABS: (all labs ordered are listed, but only abnormal results are displayed) Labs Reviewed - No data to display   EKG:   RADIOLOGY: My personal review and interpretation of imaging:    I have personally reviewed all radiology reports.   No results found.   PROCEDURES:  Critical Care performed: Yes, see critical care procedure note(s)   CRITICAL CARE Performed by: Verneda Golder   Total critical care time: 30 minutes  Critical care time was exclusive of separately billable procedures and treating other patients.  Critical care was necessary to treat or prevent imminent or life-threatening deterioration.  Critical care was time spent personally by me on the following activities: development of treatment plan with patient and/or surrogate as well as nursing, discussions with consultants, evaluation of patient's response to treatment, examination of patient, obtaining history from patient or surrogate, ordering and performing treatments and interventions, ordering and review of laboratory studies, ordering and review of radiographic studies, pulse oximetry and  re-evaluation of patient's condition.   Aaron Aas1-3 Lead EKG Interpretation  Performed by: Cortana Vanderford, Clover Dao, DO Authorized by: Briele Lagasse, Clover Dao, DO     Interpretation: normal     ECG rate:  92   ECG rate assessment: normal     Rhythm: sinus rhythm     Ectopy: none     Conduction: normal       IMPRESSION / MDM / ASSESSMENT AND PLAN / ED COURSE  I reviewed the triage vital signs and the nursing notes.    Patient here with complaints of an allergic reaction.  The patient is on the cardiac monitor to evaluate for evidence of arrhythmia and/or significant heart rate changes.   DIFFERENTIAL DIAGNOSIS (includes but not limited to):   Allergic reaction, no signs of anaphylaxis currently, doubt ACS, deep space neck infection, PTA, tonsillitis, uvulitis based on benign exam   Patient's presentation is most consistent with acute presentation with potential threat to life or  bodily function.   PLAN: Will monitor patient closely.  She does feel like her throat and tongue feel abnormal but there is no sign of objective swelling and her phonation is normal and airway is widely patent.  Will give IV fluids, Benadryl , steroids, Pepcid .  Will hold epi at this time but if symptoms worsen, we will consider giving this medication.  Patient agrees with this plan.   MEDICATIONS GIVEN IN ED: Medications  diphenhydrAMINE  (BENADRYL ) injection 50 mg (50 mg Intravenous Given 08/25/23 0124)  methylPREDNISolone  sodium succinate (SOLU-MEDROL ) 125 mg/2 mL injection 125 mg (125 mg Intravenous Given 08/25/23 0125)  sodium chloride  0.9 % bolus 1,000 mL (0 mLs Intravenous Stopped 08/25/23 0155)  famotidine  (PEPCID ) IVPB 20 mg premix (0 mg Intravenous Stopped 08/25/23 0153)     ED COURSE: Patient reports feeling much better after IV medications and is now back to baseline.  Continues to be hemodynamically stable without hypoxia, angioedema.  I feel she is safe for discharge with outpatient follow-up.  Will discharge with  steroid taper, Pepcid , Benadryl .  Will also refill her EpiPen  in case she does have any worsening symptoms at home.   At this time, I do not feel there is any life-threatening condition present. I reviewed all nursing notes, vitals, pertinent previous records.  All lab and urine results, EKGs, imaging ordered have been independently reviewed and interpreted by myself.  I reviewed all available radiology reports from any imaging ordered this visit.  Based on my assessment, I feel the patient is safe to be discharged home without further emergent workup and can continue workup as an outpatient as needed. Discussed all findings, treatment plan as well as usual and customary return precautions.  They verbalize understanding and are comfortable with this plan.  Outpatient follow-up has been provided as needed.  All questions have been answered.    CONSULTS: Admission considered but patient now asymptomatic after IV medication.   OUTSIDE RECORDS REVIEWED: Reviewed last internal medicine note on 03/31/2023.       FINAL CLINICAL IMPRESSION(S) / ED DIAGNOSES   Final diagnoses:  Allergic reaction, initial encounter     Rx / DC Orders   ED Discharge Orders          Ordered    Ambulatory Referral to Primary Care (Establish Care)        08/25/23 0235    predniSONE  (STERAPRED UNI-PAK 21 TAB) 10 MG (21) TBPK tablet        08/25/23 0236    famotidine  (PEPCID ) 20 MG tablet  2 times daily        08/25/23 0236    diphenhydrAMINE  (BENADRYL ) 50 MG tablet  Every 8 hours PRN        08/25/23 0236    EPINEPHrine  0.3 mg/0.3 mL IJ SOAJ injection  As needed        08/25/23 9528             Note:  This document was prepared using Dragon voice recognition software and may include unintentional dictation errors.   Yissel Habermehl, Clover Dao, DO 08/25/23 1806
